# Patient Record
Sex: Male | Born: 1950 | Race: White | Hispanic: No | Marital: Married | Smoking: Never smoker
Health system: Southern US, Community
[De-identification: ages and names within clinical notes are randomized; demographics above are authoritative.]

## PROBLEM LIST (undated history)

## (undated) DIAGNOSIS — G4700 Insomnia, unspecified: Secondary | ICD-10-CM

## (undated) DIAGNOSIS — R972 Elevated prostate specific antigen [PSA]: Secondary | ICD-10-CM

## (undated) DIAGNOSIS — N400 Enlarged prostate without lower urinary tract symptoms: Secondary | ICD-10-CM

## (undated) DIAGNOSIS — D649 Anemia, unspecified: Secondary | ICD-10-CM

## (undated) DIAGNOSIS — I10 Essential (primary) hypertension: Secondary | ICD-10-CM

## (undated) DIAGNOSIS — I739 Peripheral vascular disease, unspecified: Secondary | ICD-10-CM

## (undated) DIAGNOSIS — F411 Generalized anxiety disorder: Secondary | ICD-10-CM

## (undated) DIAGNOSIS — G4730 Sleep apnea, unspecified: Secondary | ICD-10-CM

## (undated) DIAGNOSIS — K219 Gastro-esophageal reflux disease without esophagitis: Secondary | ICD-10-CM

## (undated) DIAGNOSIS — C61 Malignant neoplasm of prostate: Secondary | ICD-10-CM

## (undated) DIAGNOSIS — M1712 Unilateral primary osteoarthritis, left knee: Secondary | ICD-10-CM

## (undated) HISTORY — PX: TONSILLECTOMY: SUR1361

## (undated) HISTORY — DX: Anemia, unspecified: D64.9

## (undated) HISTORY — DX: Peripheral vascular disease, unspecified: I73.9

## (undated) HISTORY — PX: POLYPECTOMY: SHX149

## (undated) HISTORY — DX: Elevated prostate specific antigen (PSA): R97.20

## (undated) HISTORY — DX: Generalized anxiety disorder: F41.1

## (undated) HISTORY — DX: Essential (primary) hypertension: I10

## (undated) HISTORY — DX: Benign prostatic hyperplasia without lower urinary tract symptoms: N40.0

## (undated) HISTORY — DX: Gastro-esophageal reflux disease without esophagitis: K21.9

## (undated) HISTORY — DX: Sleep apnea, unspecified: G47.30

## (undated) HISTORY — PX: COLONOSCOPY: SHX174

## (undated) HISTORY — DX: Insomnia, unspecified: G47.00

## (undated) HISTORY — DX: Unilateral primary osteoarthritis, left knee: M17.12

---

## 1986-07-01 HISTORY — PX: UVULOPALATOPHARYNGOPLASTY: SHX827

## 1995-07-02 HISTORY — PX: THORACOABDOMINAL AORTIC ANEURYSM REPAIR: SHX2504

## 1999-05-23 ENCOUNTER — Encounter: Payer: Self-pay | Admitting: *Deleted

## 1999-05-23 ENCOUNTER — Encounter: Admission: RE | Admit: 1999-05-23 | Discharge: 1999-05-23 | Payer: Self-pay | Admitting: *Deleted

## 1999-06-06 ENCOUNTER — Other Ambulatory Visit: Admission: RE | Admit: 1999-06-06 | Discharge: 1999-06-06 | Payer: Self-pay | Admitting: Gastroenterology

## 1999-06-06 ENCOUNTER — Encounter (INDEPENDENT_AMBULATORY_CARE_PROVIDER_SITE_OTHER): Payer: Self-pay | Admitting: Specialist

## 2000-03-05 ENCOUNTER — Encounter: Payer: Self-pay | Admitting: Gastroenterology

## 2000-03-05 ENCOUNTER — Ambulatory Visit (HOSPITAL_COMMUNITY): Admission: RE | Admit: 2000-03-05 | Discharge: 2000-03-05 | Payer: Self-pay | Admitting: Gastroenterology

## 2000-04-09 ENCOUNTER — Ambulatory Visit (HOSPITAL_BASED_OUTPATIENT_CLINIC_OR_DEPARTMENT_OTHER): Admission: RE | Admit: 2000-04-09 | Discharge: 2000-04-09 | Payer: Self-pay | Admitting: Internal Medicine

## 2000-10-06 ENCOUNTER — Ambulatory Visit (HOSPITAL_COMMUNITY): Admission: RE | Admit: 2000-10-06 | Discharge: 2000-10-06 | Payer: Self-pay | Admitting: *Deleted

## 2000-10-06 ENCOUNTER — Encounter: Payer: Self-pay | Admitting: *Deleted

## 2001-04-13 ENCOUNTER — Encounter: Payer: Self-pay | Admitting: *Deleted

## 2001-04-13 ENCOUNTER — Ambulatory Visit (HOSPITAL_COMMUNITY): Admission: RE | Admit: 2001-04-13 | Discharge: 2001-04-13 | Payer: Self-pay | Admitting: *Deleted

## 2001-10-09 ENCOUNTER — Encounter: Payer: Self-pay | Admitting: Cardiothoracic Surgery

## 2001-10-09 ENCOUNTER — Ambulatory Visit (HOSPITAL_COMMUNITY): Admission: RE | Admit: 2001-10-09 | Discharge: 2001-10-09 | Payer: Self-pay | Admitting: Cardiothoracic Surgery

## 2002-03-12 ENCOUNTER — Ambulatory Visit (HOSPITAL_COMMUNITY): Admission: RE | Admit: 2002-03-12 | Discharge: 2002-03-12 | Payer: Self-pay | Admitting: *Deleted

## 2002-03-12 ENCOUNTER — Encounter: Payer: Self-pay | Admitting: *Deleted

## 2002-10-12 ENCOUNTER — Ambulatory Visit (HOSPITAL_COMMUNITY): Admission: RE | Admit: 2002-10-12 | Discharge: 2002-10-12 | Payer: Self-pay | Admitting: Cardiothoracic Surgery

## 2002-10-12 ENCOUNTER — Encounter: Payer: Self-pay | Admitting: Cardiothoracic Surgery

## 2003-05-25 ENCOUNTER — Ambulatory Visit (HOSPITAL_COMMUNITY): Admission: RE | Admit: 2003-05-25 | Discharge: 2003-05-25 | Payer: Self-pay | Admitting: *Deleted

## 2003-11-21 ENCOUNTER — Encounter: Admission: RE | Admit: 2003-11-21 | Discharge: 2003-11-21 | Payer: Self-pay | Admitting: *Deleted

## 2004-02-20 ENCOUNTER — Encounter: Admission: RE | Admit: 2004-02-20 | Discharge: 2004-02-20 | Payer: Self-pay | Admitting: *Deleted

## 2004-06-06 ENCOUNTER — Ambulatory Visit: Payer: Self-pay | Admitting: Internal Medicine

## 2004-06-19 ENCOUNTER — Ambulatory Visit: Payer: Self-pay | Admitting: Internal Medicine

## 2004-08-06 ENCOUNTER — Encounter: Admission: RE | Admit: 2004-08-06 | Discharge: 2004-08-06 | Payer: Self-pay | Admitting: *Deleted

## 2005-03-11 ENCOUNTER — Encounter: Admission: RE | Admit: 2005-03-11 | Discharge: 2005-03-11 | Payer: Self-pay | Admitting: *Deleted

## 2005-07-01 HISTORY — PX: ILIAC ARTERY ANEURYSM REPAIR: SUR1158

## 2005-07-05 ENCOUNTER — Ambulatory Visit: Payer: Self-pay | Admitting: Internal Medicine

## 2005-09-23 ENCOUNTER — Ambulatory Visit: Payer: Self-pay | Admitting: Internal Medicine

## 2005-09-27 ENCOUNTER — Ambulatory Visit: Payer: Self-pay | Admitting: Internal Medicine

## 2005-10-31 ENCOUNTER — Encounter: Admission: RE | Admit: 2005-10-31 | Discharge: 2005-10-31 | Payer: Self-pay | Admitting: *Deleted

## 2006-05-29 ENCOUNTER — Encounter: Admission: RE | Admit: 2006-05-29 | Discharge: 2006-05-29 | Payer: Self-pay | Admitting: *Deleted

## 2006-06-26 ENCOUNTER — Ambulatory Visit: Payer: Self-pay | Admitting: Internal Medicine

## 2006-07-16 ENCOUNTER — Ambulatory Visit (HOSPITAL_COMMUNITY): Admission: RE | Admit: 2006-07-16 | Discharge: 2006-07-16 | Payer: Self-pay | Admitting: *Deleted

## 2006-09-10 ENCOUNTER — Ambulatory Visit: Payer: Self-pay | Admitting: *Deleted

## 2006-09-12 ENCOUNTER — Inpatient Hospital Stay (HOSPITAL_COMMUNITY): Admission: RE | Admit: 2006-09-12 | Discharge: 2006-09-17 | Payer: Self-pay | Admitting: *Deleted

## 2006-09-12 ENCOUNTER — Encounter (INDEPENDENT_AMBULATORY_CARE_PROVIDER_SITE_OTHER): Payer: Self-pay | Admitting: Specialist

## 2006-09-12 ENCOUNTER — Ambulatory Visit: Payer: Self-pay | Admitting: *Deleted

## 2006-09-22 ENCOUNTER — Ambulatory Visit: Payer: Self-pay | Admitting: Internal Medicine

## 2006-09-23 ENCOUNTER — Ambulatory Visit: Payer: Self-pay | Admitting: *Deleted

## 2006-10-02 ENCOUNTER — Ambulatory Visit: Payer: Self-pay | Admitting: *Deleted

## 2006-11-13 ENCOUNTER — Ambulatory Visit: Payer: Self-pay | Admitting: *Deleted

## 2006-12-03 ENCOUNTER — Ambulatory Visit: Payer: Self-pay | Admitting: Internal Medicine

## 2006-12-03 LAB — CONVERTED CEMR LAB: PSA: 4.57 ng/mL — ABNORMAL HIGH (ref 0.10–4.00)

## 2006-12-18 ENCOUNTER — Ambulatory Visit: Payer: Self-pay | Admitting: Internal Medicine

## 2007-03-27 ENCOUNTER — Ambulatory Visit: Payer: Self-pay | Admitting: Internal Medicine

## 2007-03-27 LAB — CONVERTED CEMR LAB: PSA: 4.62 ng/mL — ABNORMAL HIGH (ref 0.10–4.00)

## 2007-11-24 ENCOUNTER — Telehealth: Payer: Self-pay | Admitting: Internal Medicine

## 2007-11-25 ENCOUNTER — Encounter: Payer: Self-pay | Admitting: Internal Medicine

## 2007-11-25 ENCOUNTER — Telehealth: Payer: Self-pay | Admitting: Internal Medicine

## 2007-11-26 ENCOUNTER — Encounter: Admission: RE | Admit: 2007-11-26 | Discharge: 2007-11-26 | Payer: Self-pay | Admitting: Gastroenterology

## 2007-11-26 ENCOUNTER — Ambulatory Visit: Payer: Self-pay | Admitting: *Deleted

## 2008-01-25 ENCOUNTER — Telehealth (INDEPENDENT_AMBULATORY_CARE_PROVIDER_SITE_OTHER): Payer: Self-pay | Admitting: *Deleted

## 2008-07-08 ENCOUNTER — Ambulatory Visit: Payer: Self-pay | Admitting: Internal Medicine

## 2008-07-08 DIAGNOSIS — D649 Anemia, unspecified: Secondary | ICD-10-CM | POA: Insufficient documentation

## 2008-07-08 DIAGNOSIS — I10 Essential (primary) hypertension: Secondary | ICD-10-CM

## 2008-07-08 DIAGNOSIS — Z800 Family history of malignant neoplasm of digestive organs: Secondary | ICD-10-CM | POA: Insufficient documentation

## 2008-07-08 DIAGNOSIS — I739 Peripheral vascular disease, unspecified: Secondary | ICD-10-CM | POA: Insufficient documentation

## 2008-07-08 DIAGNOSIS — K219 Gastro-esophageal reflux disease without esophagitis: Secondary | ICD-10-CM | POA: Insufficient documentation

## 2008-07-08 DIAGNOSIS — R972 Elevated prostate specific antigen [PSA]: Secondary | ICD-10-CM

## 2008-07-08 DIAGNOSIS — N400 Enlarged prostate without lower urinary tract symptoms: Secondary | ICD-10-CM

## 2008-07-08 DIAGNOSIS — G4700 Insomnia, unspecified: Secondary | ICD-10-CM | POA: Insufficient documentation

## 2008-07-08 DIAGNOSIS — F411 Generalized anxiety disorder: Secondary | ICD-10-CM

## 2008-07-08 DIAGNOSIS — F419 Anxiety disorder, unspecified: Secondary | ICD-10-CM | POA: Insufficient documentation

## 2008-07-08 DIAGNOSIS — F329 Major depressive disorder, single episode, unspecified: Secondary | ICD-10-CM

## 2008-07-08 DIAGNOSIS — F32A Depression, unspecified: Secondary | ICD-10-CM | POA: Insufficient documentation

## 2008-07-08 HISTORY — DX: Elevated prostate specific antigen (PSA): R97.20

## 2008-07-08 HISTORY — DX: Insomnia, unspecified: G47.00

## 2008-07-08 HISTORY — DX: Benign prostatic hyperplasia without lower urinary tract symptoms: N40.0

## 2008-07-08 HISTORY — DX: Anemia, unspecified: D64.9

## 2008-07-08 HISTORY — DX: Gastro-esophageal reflux disease without esophagitis: K21.9

## 2008-07-08 HISTORY — DX: Essential (primary) hypertension: I10

## 2008-07-08 HISTORY — DX: Peripheral vascular disease, unspecified: I73.9

## 2008-07-08 HISTORY — DX: Generalized anxiety disorder: F41.1

## 2008-12-12 ENCOUNTER — Telehealth: Payer: Self-pay | Admitting: Internal Medicine

## 2008-12-13 ENCOUNTER — Ambulatory Visit: Payer: Self-pay | Admitting: Internal Medicine

## 2008-12-14 LAB — CONVERTED CEMR LAB
ALT: 20 units/L (ref 0–53)
Alkaline Phosphatase: 59 units/L (ref 39–117)
Basophils Relative: 0.9 % (ref 0.0–3.0)
Bilirubin, Direct: 0.2 mg/dL (ref 0.0–0.3)
Cholesterol: 159 mg/dL (ref 0–200)
Creatinine, Ser: 1.1 mg/dL (ref 0.4–1.5)
Eosinophils Absolute: 0.1 10*3/uL (ref 0.0–0.7)
Glucose, Bld: 96 mg/dL (ref 70–99)
HCT: 44.6 % (ref 39.0–52.0)
HDL: 38.8 mg/dL — ABNORMAL LOW (ref >39.00)
Hemoglobin, Urine: NEGATIVE
Hemoglobin: 15.3 g/dL (ref 13.0–17.0)
Ketones, ur: NEGATIVE mg/dL
LDL Cholesterol: 102 mg/dL — ABNORMAL HIGH (ref 0–99)
Leukocytes, UA: NEGATIVE
Lymphocytes Relative: 35 % (ref 12.0–46.0)
MCHC: 34.2 g/dL (ref 30.0–36.0)
Monocytes Absolute: 0.4 10*3/uL (ref 0.1–1.0)
Neutro Abs: 2.8 10*3/uL (ref 1.4–7.7)
Nitrite: NEGATIVE
PSA: 4.01 ng/mL — ABNORMAL HIGH (ref 0.10–4.00)
Potassium: 4.1 meq/L (ref 3.5–5.1)
RBC: 5.19 M/uL (ref 4.22–5.81)
TSH: 1.76 microintl units/mL (ref 0.35–5.50)
Total Bilirubin: 1.1 mg/dL (ref 0.3–1.2)
Total CHOL/HDL Ratio: 4
Total Protein, Urine: NEGATIVE mg/dL
Triglycerides: 91 mg/dL (ref 0.0–149.0)
Urine Glucose: NEGATIVE mg/dL
Urobilinogen, UA: 0.2 (ref 0.0–1.0)
WBC: 5.1 10*3/uL (ref 4.5–10.5)

## 2008-12-15 ENCOUNTER — Encounter (INDEPENDENT_AMBULATORY_CARE_PROVIDER_SITE_OTHER): Payer: Self-pay | Admitting: *Deleted

## 2008-12-15 ENCOUNTER — Ambulatory Visit: Payer: Self-pay | Admitting: *Deleted

## 2008-12-15 ENCOUNTER — Encounter: Admission: RE | Admit: 2008-12-15 | Discharge: 2008-12-15 | Payer: Self-pay | Admitting: Vascular Surgery

## 2009-01-05 ENCOUNTER — Telehealth (INDEPENDENT_AMBULATORY_CARE_PROVIDER_SITE_OTHER): Payer: Self-pay | Admitting: *Deleted

## 2009-07-17 ENCOUNTER — Telehealth: Payer: Self-pay | Admitting: Internal Medicine

## 2009-08-21 ENCOUNTER — Telehealth: Payer: Self-pay | Admitting: Endocrinology

## 2009-10-05 ENCOUNTER — Telehealth: Payer: Self-pay | Admitting: Internal Medicine

## 2009-10-09 ENCOUNTER — Telehealth: Payer: Self-pay | Admitting: Internal Medicine

## 2009-11-14 ENCOUNTER — Ambulatory Visit: Payer: Self-pay | Admitting: Internal Medicine

## 2009-11-14 LAB — CONVERTED CEMR LAB
Albumin: 4.2 g/dL (ref 3.5–5.2)
BUN: 16 mg/dL (ref 6–23)
Bilirubin Urine: NEGATIVE
Bilirubin, Direct: 0.1 mg/dL (ref 0.0–0.3)
Calcium: 9.4 mg/dL (ref 8.4–10.5)
Chloride: 103 meq/L (ref 96–112)
Eosinophils Relative: 1.1 % (ref 0.0–5.0)
HDL: 38.6 mg/dL — ABNORMAL LOW (ref >39.00)
Hemoglobin, Urine: NEGATIVE
Ketones, ur: NEGATIVE mg/dL
Lymphocytes Relative: 34.2 % (ref 12.0–46.0)
Lymphs Abs: 1.9 10*3/uL (ref 0.7–4.0)
MCHC: 34 g/dL (ref 30.0–36.0)
Monocytes Relative: 9.5 % (ref 3.0–12.0)
Nitrite: NEGATIVE
PSA: 4.29 ng/mL — ABNORMAL HIGH (ref 0.10–4.00)
Platelets: 161 10*3/uL (ref 150.0–400.0)
RDW: 13.9 % (ref 11.5–14.6)
TSH: 1.95 microintl units/mL (ref 0.35–5.50)
Total Bilirubin: 0.6 mg/dL (ref 0.3–1.2)
Total Protein, Urine: NEGATIVE mg/dL
pH: 8 (ref 5.0–8.0)

## 2010-01-31 ENCOUNTER — Encounter: Payer: Self-pay | Admitting: Internal Medicine

## 2010-01-31 ENCOUNTER — Encounter: Admission: RE | Admit: 2010-01-31 | Discharge: 2010-01-31 | Payer: Self-pay | Admitting: Vascular Surgery

## 2010-01-31 ENCOUNTER — Ambulatory Visit: Payer: Self-pay | Admitting: Vascular Surgery

## 2010-05-14 ENCOUNTER — Telehealth: Payer: Self-pay | Admitting: Internal Medicine

## 2010-07-21 ENCOUNTER — Encounter: Payer: Self-pay | Admitting: *Deleted

## 2010-08-02 NOTE — Letter (Signed)
Summary: [facility]  [facility]   Imported By: Sherian Rein 02/14/2010 08:14:08  _____________________________________________________________________  External Attachment:    Type:   Image     Comment:   External Document

## 2010-08-02 NOTE — Progress Notes (Signed)
Summary: medication refill  Phone Note Refill Request Message from:  Fax from Pharmacy on May 14, 2010 1:41 PM  Refills Requested: Medication #1:  AMBIEN CR 12.5 MG CR-TABS 1 by mouth qhs as needed   Dosage confirmed as above?Dosage Confirmed   Last Refilled: 11/14/2009   Notes: [facility] [place], [phone] Initial call taken by: Robin Ewing CMA (AAMA),  May 14, 2010 1:46 PM    New/Updated Medications: AMBIEN CR 12.5 MG CR-TABS (ZOLPIDEM TARTRATE) 1 by mouth qhs as needed Prescriptions: AMBIEN CR 12.5 MG CR-TABS (ZOLPIDEM TARTRATE) 1 by mouth qhs as needed  #90 x 1   Entered and Authorized by:   Corwin Levins MD   Signed by:   Corwin Levins MD on 05/14/2010   Method used:   Print then Give to Patient   RxID:   [id number]  done hardcopy to LIM side B - dahlia Corwin Levins MD  May 14, 2010 2:11 PM   Rx faxed to pharmacy Margaret Pyle, CMA  May 14, 2010 2:41 PM

## 2010-08-02 NOTE — Progress Notes (Signed)
Summary: Med Refill  Phone Note Refill Request  on October 05, 2009 10:24 AM  Refills Requested: Medication #1:  AMBIEN CR 12.5 MG CR-TABS 1 by mouth qhs as needed - needs Return office visit for further refills   Dosage confirmed as above?Dosage Confirmed   Notes: [facility] [address] [phone] Initial call taken by: Scharlene Gloss,  October 05, 2009 10:25 AM  Follow-up for Phone Call        no  OV overdue Follow-up by: Corwin Levins MD,  October 05, 2009 1:06 PM  Additional Follow-up for Phone Call Additional follow up Details #1::        called pt left msg to call and schedule OV. Additional Follow-up by: Scharlene Gloss,  October 05, 2009 2:10 PM

## 2010-08-02 NOTE — Progress Notes (Signed)
Summary: Rx refill  Phone Note Call from Patient Call back at Home Phone [phone]   Caller: Patient Summary of Call: Pt called stating that he has made OV 04/18 to see MD but has no more Ambien or Atenolol. Pt is going out of town today and is requesting refill to last until appt date. Initial call taken by: Margaret Pyle, CMA,  October 09, 2009 9:46 AM  Follow-up for Phone Call        done hardcopy to LIM side B - dahlia  Follow-up by: Corwin Levins MD,  October 09, 2009 12:14 PM  Additional Follow-up for Phone Call Additional follow up Details #1::        pt informed, Rx faxed  Additional Follow-up by: Margaret Pyle, CMA,  October 09, 2009 1:01 PM    Prescriptions: AMBIEN CR 12.5 MG CR-TABS (ZOLPIDEM TARTRATE) 1 by mouth qhs as needed - needs Return office visit for further refills  #30 x 0   Entered by:   Margaret Pyle, CMA   Authorized by:   Corwin Levins MD   Signed by:   Margaret Pyle, CMA on 10/09/2009   Method used:   Printed then faxed to ...       [facility]  [address] [id number]       [address]       [place], [place]  [address]       Ph: [phone] or [phone]       Fax: [phone]   RxID:   [id number] ATENOLOL 50 MG  TABS (ATENOLOL) 1 by mouth qd  #30 Tablet x 0   Entered by:   Margaret Pyle, CMA   Authorized by:   Corwin Levins MD   Signed by:   Margaret Pyle, CMA on 10/09/2009   Method used:   Printed then faxed to ...       [facility] [id number]       [address]       [place], [place]  [address]       Ph: [phone] or [phone]       Fax: [phone]   RxID:   [id number]

## 2010-08-02 NOTE — Progress Notes (Signed)
Summary: med refill  Phone Note Refill Request  on July 17, 2009 9:24 AM  Refills Requested: Medication #1:  AMBIEN CR 12.5 MG CR-TABS 1 by mouth qhs as needed   Dosage confirmed as above?Dosage Confirmed   Notes: [facility] [address] [place], [phone] Initial call taken by: Scharlene Gloss,  July 17, 2009 9:24 AM  Follow-up for Phone Call        done hardcopy to LIM side B - dahlia  Follow-up by: Corwin Levins MD,  July 17, 2009 1:02 PM  Additional Follow-up for Phone Call Additional follow up Details #1::        rx faxed to pharmacy Additional Follow-up by: Margaret Pyle, CMA,  July 17, 2009 1:20 PM    New/Updated Medications: AMBIEN CR 12.5 MG CR-TABS (ZOLPIDEM TARTRATE) 1 by mouth qhs as needed - needs Return office visit for further refills Prescriptions: AMBIEN CR 12.5 MG CR-TABS (ZOLPIDEM TARTRATE) 1 by mouth qhs as needed - needs Return office visit for further refills  #30 x 0   Entered and Authorized by:   Corwin Levins MD   Signed by:   Corwin Levins MD on 07/17/2009   Method used:   Print then Give to Patient   RxID:   [id number]

## 2010-08-02 NOTE — Progress Notes (Signed)
Summary: Ambien  Phone Note Refill Request Message from:  Fax from Pharmacy on August 21, 2009 9:22 AM  Refills Requested: Medication #1:  AMBIEN CR 12.5 MG CR-TABS 1 by mouth qhs as needed - needs Return office visit for further refills Next Appointment Scheduled: none Initial call taken by: Lucious Groves,  August 21, 2009 9:22 AM  Follow-up for Phone Call        i printed rx f/u ov is needed Follow-up by: Minus Breeding MD,  August 21, 2009 10:03 AM  Additional Follow-up for Phone Call Additional follow up Details #1::        rx faxed to pharmacy Additional Follow-up by: Margaret Pyle, CMA,  August 21, 2009 11:35 AM    Prescriptions: AMBIEN CR 12.5 MG CR-TABS (ZOLPIDEM TARTRATE) 1 by mouth qhs as needed - needs Return office visit for further refills  #30 x 0   Entered and Authorized by:   Minus Breeding MD   Signed by:   Minus Breeding MD on 08/21/2009   Method used:   Print then Give to Patient   RxID:   [id number]

## 2010-08-02 NOTE — Assessment & Plan Note (Signed)
Summary: MED REFILL--REQUESTING PSA AND COLONSCOPY--STC   Vital Signs:  Patient profile:   60 year old male Height:      72.5 inches Weight:      196.13 pounds BMI:     26.33 O2 Sat:      97 % on Room air Temp:     97.4 degrees F oral Pulse rate:   62 / minute BP sitting:   120 / 78  (left arm) Cuff size:   regular  Vitals Entered ByZella Ball Ewing (Nov 14, 2009 11:17 AM)  O2 Flow:  Room air  CC: medication refills, labs/RE   CC:  medication refills and labs/RE.  History of Present Illness: overall doing well, no complaints,  Pt denies CP, sob, doe, wheezing, orthopnea, pnd, worsening LE edema, palps, dizziness or syncope  Pt denies new neuro symptoms such as headache, facial or extremity weakness   Problems Prior to Update: 1)  Preventive Health Care  (ICD-V70.0) 2)  Anemia-nos  (ICD-285.9) 3)  Benign Prostatic Hypertrophy  (ICD-600.00) 4)  Colonic Polyps, Hx of  (ICD-V12.72) 5)  Insomnia-sleep Disorder-unspec  (ICD-780.52) 6)  Anxiety  (ICD-300.00) 7)  Peripheral Vascular Disease  (ICD-443.9) 8)  Gerd  (ICD-530.81) 9)  Psa, Increased  (ICD-790.93) 10)  Hypertension  (ICD-401.9)  Medications Prior to Update: 1)  Atenolol 50 Mg  Tabs (Atenolol) .Marland Kitchen.. 1 By Mouth Qd 2)  Ambien Cr 12.5 Mg Cr-Tabs (Zolpidem Tartrate) .Marland Kitchen.. 1 By Mouth Qhs As Needed - Needs Return Office Visit For Further Refills 3)  Meclizine Hcl 12.5 Mg Tabs (Meclizine Hcl) .Marland Kitchen.. 1 - 2 By Mouth Q 6 Hrs As Needed  Current Medications (verified): 1)  Atenolol 50 Mg  Tabs (Atenolol) .Marland Kitchen.. 1 By Mouth Once Daily 2)  Ambien Cr 12.5 Mg Cr-Tabs (Zolpidem Tartrate) .Marland Kitchen.. 1 By Mouth Qhs As Needed 3)  Meclizine Hcl 12.5 Mg Tabs (Meclizine Hcl) .Marland Kitchen.. 1 - 2 By Mouth Q 6 Hrs As Needed For Dizziness  Allergies (verified): 1)  ! 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         all otherwise negative per pt -  except for ongoing work stress  Physical Exam  General:  alert and overweight-appearing.   Head:  normocephalic and atraumatic.   Eyes:  vision grossly intact, pupils equal, and pupils round.   Ears:  R ear normal and L ear normal.   Nose:  no external deformity and no nasal discharge.   Mouth:  no gingival abnormalities and pharynx pink and moist.   Neck:  supple and no masses.   Lungs:  normal respiratory effort and normal breath sounds.   Heart:  normal rate and regular rhythm.   Abdomen:  soft, non-tender, and normal bowel sounds.   Msk:  no joint tenderness and no joint swelling.   Extremities:  no edema, no erythema  Neurologic:  cranial nerves II-XII intact and strength normal in all extremities.   Skin:  color normal and no rashes.   Psych:  not depressed appearing and moderately anxious.     Impression & Recommendations:  Problem # 1:  Preventive  Health Care (ICD-V70.0)  Overall doing well, age appropriate education and counseling updated and referral for appropriate preventive services done unless declined, immunizations up to date or declined, diet counseling done if overweight, urged to quit smoking if smokes , most recent labs reviewed and current ordered if appropriate, ecg reviewed or declined (interpretation per ECG scanned in the EMR if done); information regarding Medicare Prevention requirements given if appropriate; speciality referrals updated as appropriate   Orders: TLB-BMP (Basic Metabolic Panel-BMET) (80048-METABOL) TLB-CBC Platelet - w/Differential (85025-CBCD) TLB-Hepatic/Liver Function Pnl (80076-HEPATIC) TLB-Lipid Panel (80061-LIPID) TLB-TSH (Thyroid Stimulating Hormone) (84443-TSH) TLB-PSA (Prostate Specific Antigen) (84153-PSA) TLB-Udip ONLY (81003-UDIP)  Problem # 2:  HYPERTENSION (ICD-401.9)  His updated medication list for this problem includes:    Atenolol 50 Mg Tabs (Atenolol) .Marland Kitchen... 1 by mouth once daily  BP today: 120/78 Prior BP: 110/62 (07/08/2008)  Labs Reviewed: K+: 4.1 (12/13/2008) Creat: : 1.1 (12/13/2008)   Chol: 159 (12/13/2008)   HDL: 38.80 (12/13/2008)   LDL: 102 (12/13/2008)   TG: 91.0 (12/13/2008) stable overall by hx and exam, ok to continue meds/tx as is   Complete Medication List: 1)  Atenolol 50 Mg Tabs (Atenolol) .Marland Kitchen.. 1 by mouth once daily 2)  Ambien Cr 12.5 Mg Cr-tabs (Zolpidem tartrate) .Marland Kitchen.. 1 by mouth qhs as needed 3)  Meclizine Hcl 12.5 Mg Tabs (Meclizine hcl) .Marland Kitchen.. 1 - 2 by mouth q 6 hrs as needed for dizziness  Patient Instructions: 1)  Please go to the Lab in the basement for your blood and/or urine tests today 2)  Continue all previous medications as before this visit  3)  Please schedule a follow-up appointment in 1 year or sooner if needed Prescriptions: MECLIZINE HCL 12.5 MG TABS (MECLIZINE HCL) 1 - 2 by mouth q 6 hrs as needed for dizziness  #50 x 1   Entered and  Authorized by:   Corwin Levins MD   Signed by:   Corwin Levins MD on 11/14/2009   Method used:   Print then Give to Patient   RxID:   [id number] AMBIEN CR 12.5 MG CR-TABS (ZOLPIDEM TARTRATE) 1 by mouth qhs as needed  #90 x 1   Entered and Authorized by:   Corwin Levins MD   Signed by:   Corwin Levins MD on 11/14/2009   Method used:   Print then Give to Patient   RxID:   [id number] ATENOLOL 50 MG  TABS (ATENOLOL) 1 by mouth once daily  #90 x 3   Entered and Authorized by:   Corwin Levins MD   Signed by:   Corwin Levins MD on 11/14/2009   Method used:   Print then Give to Patient   RxID:   [id number]

## 2010-09-13 ENCOUNTER — Encounter: Payer: Self-pay | Admitting: Internal Medicine

## 2010-09-18 NOTE — Letter (Signed)
Summary: Colonoscopy Letter  [facility]  [address]   [place], [place] [address]   Phone: [phone]  Fax: [phone]      September 13, 2010 MRN: [id number]   [facility] [address] [place], [place]  [address]   Dear Mr. ENDICOTT,   According to your medical record, it is time for you to schedule a Colonoscopy. The American Cancer Society recommends this procedure as a method to detect early colon cancer. Patients with a family history of colon cancer, or a personal history of colon polyps or inflammatory bowel disease are at increased risk.  This letter has been generated based on the recommendations made at the time of your procedure. If you feel that in your particular situation this may no longer apply, please contact our office.  Please call our office at [phone] to schedule this appointment or to update your records at your earliest convenience.  Thank you for cooperating with [place] to provide you with the very best care possible.   Sincerely,   Stan Head, M.D.  [facility] [phone]

## 2010-11-13 NOTE — Assessment & Plan Note (Signed)
OFFICE VISIT   James Mullen, James Mullen  DOB:  06-25-1951                                       01/31/2010  FIEPP#:[id number]   The patient returns for follow-up today.  He was previously a patient of  Dr. 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  He was last seen by Dr. Madilyn Fireman in June of 2010.  Since Dr. Madilyn Fireman  has moved to [place] I have assumed the patient's care.  He denies  any abdominal pain, back pain or significant problems in the past year.  He is very active with outdoor activities including canoeing and  walking.  I reviewed his previous operative note today which shows that  the aorta was replaced from his descending thoracic aorta just distal to  the left subclavian vein down to the visceral segment including a  visceral patch and then subsequently he has undergone an infrarenal  abdominal aortic aneurysm repair with an aortobi-iliac graft.  The  aortobi-iliac graft was in 2008.  The thoracoabdominal aneurysm was in  1997.  He had a CT angio of the chest, abdomen and pelvis performed  today which shows the graft is patent without any evidence of  pseudoaneurysm.  Graft diameter is slightly greater than 3 cm at the  chest level.  Aorta is of normal caliber on infrarenal position.  Visceral and renal arteries are widely patent.  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  Otherwise there is no evidence of  aneurysmal disease in the aorta or the iliac arteries.  The femoral  vessels are ectatic.   PHYSICAL EXAMINATION:  Blood pressure is 130/81 in the left arm, heart  rate is 54 and regular, respirations 20.  HEENT:  Unremarkable.  NECK:  Has 2+ carotid pulses without bruit.  Chest:  Clear to auscultation.  Cardiac:  Exam is regular rate rhythm without murmur.  Abdomen:  Soft,  nontender, nondistended with well-healed incision.  He  has 2+ femoral  pulses bilaterally.  He has 2+ dorsalis pedis pulses bilaterally.   REVIEW OF SYSTEMS:  He denies any shortness of breath or chest pain.   Overall the patient is doing well.  He had no problems in the past year  and his aneurysm is stable on CT angio.  I believe the best option for  him at this point is to go to once yearly abdominal aortic ultrasounds.  We may consider extending this out further.  I do not believe he needs  his chest reevaluated for several years.  He will follow up with me in  one year's time.     Janetta Hora. Fields, MD  Electronically Signed   CEF/MEDQ  D:  01/31/2010  T:  01/31/2010  Job:  [id number]   cc:   Corwin Levins, MD  Kerin Perna, M.D.

## 2010-11-13 NOTE — Assessment & Plan Note (Signed)
OFFICE VISIT   James Mullen, James Mullen  DOB:  09/01/1950                                       11/26/2007  ZOXWR#:[id number]   The patient returned today for continued followup of his repaired type 3  aortic dissection and also infrarenal abdominal aortic aneurysm repair.  This most recent operation was in March of 2008 with infrarenal aortic  aneurysm repair and bilateral common iliac aneurysm repair.   His CT scan looks excellent.  His thoracic repair is intact without  complicating features.  The infrarenal aorta and bi-iliac graft are  intact.   The patient has no major complaints at this time.  BP is 117/67, pulse  75 per minute.  His chest is clear without rales or rhonchi.  Normal  heart sounds without murmurs.  Incisions well-healed.  Abdomen:  Soft,  nontender.  No masses or organomegaly.  2+ femoral pulses bilaterally.   The patient is doing extremely well following his extensive aortic  repairs.  We will plan followup with him again in 1 year with repeat CT  scan.   Balinda Quails, M.D.  Electronically Signed   PGH/MEDQ  D:  11/26/2007  T:  11/27/2007  Job:  [id number]   cc:   Corwin Levins, MD  Kerin Perna, M.D.

## 2010-11-13 NOTE — Assessment & Plan Note (Signed)
OFFICE VISIT   KEONA, SHEFFLER  DOB:  1951/04/28                                       12/15/2008  ZOXWR#:[id number]   The patient is a 60 year old gentleman who has a history of a type 3  aortic dissection requiring urgent repair in 1997, followed by an  infrarenal abdominal aortic aneurysm repair and bilateral iliac aneurysm  repair carried out in 2008.  He follows up with me on a yearly basis due  to his aortic pathology.   He underwent a CT angiogram of the chest, abdomen and pelvis prior to  his current visit.  Findings are generally stable.  Postop surgical  changes noted in the chest without interval change over the past year.  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  The abdominal aorta is stable  without complicating features associated with his postoperative repair.  Similar findings in the abdominal aorta with an aortobi-iliac graft.  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  This is unchanged.  No other acute findings.   The patient appears well.  He is alert and oriented.  BP is 106/64,  pulse 67 per minute.  Abdomen is soft, nontender.  Intact femoral pulses  bilaterally.  His chest is clear with equal air entry bilaterally.  Normal heart sounds without murmurs.   Overall the patient is doing well.  We will plan followup again in 1  year with repeat CT scan.   Balinda Quails, M.D.  Electronically Signed   PGH/MEDQ  D:  12/15/2008  T:  12/16/2008  Job:  [id number]   cc:   Kerin Perna, M.D.  Corwin Levins, MD

## 2010-11-16 NOTE — Op Note (Signed)
[name], [name] NO.:  [id number]   MEDICAL RECORD NO.:  [id number]          PATIENT TYPE:  INP   LOCATION:  2039                         FACILITY:  [facility]   PHYSICIAN:  Balinda Quails, M.D.    DATE OF BIRTH:  June 17, 1951   DATE OF PROCEDURE:  09/15/2006  DATE OF DISCHARGE:                               OPERATIVE REPORT   SURGEON:  Denman George, MD   ASSISTANT:  Constance Holster, PA.   ANESTHETIC:  General endotracheal.   PREOPERATIVE DIAGNOSES:  1. A 5.5 cm infrarenal abdominal aortic aneurysm.  2. 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  3. Right common iliac artery ectasia.   POSTOPERATIVE DIAGNOSES:  1. A 5.5 cm infrarenal abdominal aortic aneurysm.  2. 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  3. Right common iliac artery ectasia.   PROCEDURES:  1. Repair of infrarenal abdominal aortic aneurysm and common iliac      aneurysms with aortobi-iliac graft.  2. Lysis of adhesions.   CLINICAL NOTE:  Mr. James Mullen is a 60 year old male with a history  of type B aortic dissection approximately 10 years ago requiring urgent  repair for rapid enlargement.  At that time he was noted to a small  infrarenal abdominal aortic aneurysm and dilatation of his iliac  arteries.  He has been followed in the office and this aneurysm of his  infrarenal aorta is now increased to 5.5 cm in size, and his left common  iliac artery measures 3 cm.  He is brought to the operating at this time  for open elective repair.   OPERATIVE PROCEDURE:  The patient brought to the operating room in  stable hemodynamic condition.  Placed under general endotracheal  anesthesia.  Foley catheter, arterial line, Swan-Ganz catheter in place.  In the supine position, the abdomen and both legs were prepped and  draped in a sterile fashion.   A midline skin incision made through the scar from xiphoid to pubis.  Dissection carried down through the subcutaneous tissue with  electrocautery.  The peritoneal cavity entered.  There were dense  adhesions in the left upper quadrant from previous left thoracoabdominal  incision.  The omentum had to be mobilized off the anterior abdominal  wall.  There were adhesions to small bowel also present to the omentum,  and these were divided.  The liver was freed from the anterior abdominal  wall.  The omentum fully mobilized.   The small bowel was also adhesed to the sigmoid colon.  Small bowel  fully mobilized to allow retraction to the right.   Limited laparotomy evaluation was carried out due to dense adhesions.  The right upper quadrant was fixed with adhesions.  The left colon  revealed no masses.  The retroperitoneum did verify an infrarenal  abdominal aortic aneurysm.   The small bowel was retracted to the right, transverse colon brought  superiorly.  The retroperitoneum incised along the infrarenal aorta.  The retroperitoneal dissection carried superiorly up to the origin of  the renal arteries bilaterally.  There was a short infrarenal aortic  neck.  Adequate neck for infrarenal clamp placement.  Renal arteries  clearly identified bilaterally.  The left renal vein skeletonized and  retracted superiorly.   Distal dissection then carried down along the aortic aneurysm sac and  the origin of the superior mesenteric artery was encircled with a fine  vessel loop.   Further distal dissection then carried along the right common iliac  artery.  The right common iliac bifurcation into hypogastric and  external iliac were freed.  The right ureter reflected inferiorly.  The  right external iliac and hypogastric arteries were encircled with vessel  loops.   The sigmoid mesocolon was then mobilized along the peritoneal  reflection.  Retroperitoneum entered and the left common iliac  bifurcation was exposed.  The left ureter reflected laterally.  The  origin of the external and internal iliac arteries were encircled  with  vessel loops on the left.   The patient administered 5000 units of heparin intravenously, 25 g of  mannitol intravenously.  The infrarenal aorta controlled with an aortic  DeBakey clamp.  The common iliac arteries bilaterally were controlled  with coarctation clamps and the hypogastric arteries controlled with  angled coarctation clamps.   The aneurysm sac opened longitudinally.  Backbleeding lumbar vessels  controlled with figure-of-eight 2-0 silk suture.  A small amount of  laminated thrombus removed.  The aneurysm sac opened up to the neck.  An  18 x 9 bifurcated Hemashield graft was anastomosed to the infrarenal  aortic neck using running 3-0 Prolene suture.  At completion of the  proximal anastomosis, the graft was flushed and each limb of the graft  controlled with a Fogarty clamp.   The right limb of the graft was then brought down to the right common  iliac bifurcation.  The right common iliac artery divided just proximal  to its bifurcation and the right limb of the graft was anastomosed end-  to-end to this using running 5-0 Prolene suture.  At completion of the  right iliac anastomosis, the right limb was flushed and the right leg  reperfused.   The left limb of the graft was tunneled through the left common iliac  artery aneurysm and the left common iliac artery divided at its  bifurcation.  The left limb of the graft was anastomosed end-to-end to  the left common iliac bifurcation using running 5-0 Prolene suture.  At  completion of this, the graft was flushed, clamps were removed, the left  leg reperfused.   Adequate hemostasis obtained.  Sponge and instrument counts correct.  The patient administered 50 mg of protamine intravenously.   The aneurysm was then closed over the graft using running 2-0 Vicryl  suture.  The retroperitoneum closed over the graft with running 3-0  Vicryl suture.  The sponge and instrument count was correct.  The abdomen was examined  to ensure there were no retained instruments or  sponges.   The midline abdominal fascia then closed running #1 PDS suture.  Skin  closed with staples.  Sterile dressings were applied.  The patient  tolerated the procedure well.      Balinda Quails, M.D.  Electronically Signed     PGH/MEDQ  D:  09/15/2006  T:  09/16/2006  Job:  [id number]   cc:   Noralyn Pick. Eden Emms, MD, [facility]  Corwin Levins, MD

## 2010-11-16 NOTE — Op Note (Signed)
James Mullen, James Mullen             ACCOUNT NO.:  [id number]   MEDICAL RECORD NO.:  [id number]          PATIENT TYPE:  AMB   LOCATION:  SDS                          FACILITY:  [facility]   PHYSICIAN:  Balinda Quails, M.D.    DATE OF BIRTH:  [birth year]   DATE OF PROCEDURE:  07/16/2006  DATE OF DISCHARGE:  07/16/2006                               OPERATIVE REPORT   PHYSICIAN:  Denman George, MD.   DIAGNOSES:  1. A 5.9-cm infrarenal abdominal aortic aneurysm.  2. A 2.7-cm left common iliac artery aneurysm.  3. Ectasia, right common iliac artery.   PROCEDURE:  Abdominal aortogram with pelvic runoff arteriography.   ACCESS:  Right common femoral artery 5-French sheath.   CONTRAST:  100 mL of Visipaque.   COMPLICATIONS:  None apparent.   CLINICAL NOTE:  James Mullen is a 60 year old male with history of  type B aortic dissection requiring urgent repair in 1997.  His initial  repair was carried out down to the level of the renal arteries.  He is  known to have aneurysmal disease of his infrarenal aorta and left common  iliac artery.  He has been followed the office regularly and his  infrarenal aorta has now enlarged to 5.9 cm in transverse diameter with  a left common iliac artery aneurysm of 2.7 cm.  He is brought to the  cath lab at this time for planned diagnostic arteriography for further  workup and planning of surgery for repair of his infrarenal abdominal  aortic aneurysm.   PROCEDURE NOTE:  The patient was brought to the cath lab in stable  condition, placed in a supine position, both groins prepped and draped  in a sterile fashion.   Skin and subcutaneous tissue in the right groin were instilled with 1%  Xylocaine.  A needle was introduced into the right common femoral  artery.  A 0.35 J-wire was passed through the needle into the mid  abdominal aorta.  A 5-French sheath was advanced over the guidewire and  the dilator and guidewire removed.  The sheath was flushed with  heparin  and saline solution.  A standard pigtail catheter was advanced over the  guidewire to the mid abdominal aorta.  Standard AP mid abdominal  aortogram was obtained.  This revealed widely patent bilateral renal  arteries.  There was a juxtarenal abdominal aortic aneurysm present  which extended from the renal artery origins down to the aortic  bifurcation.  There was moderate left angulation and tortuosity.   A lateral aortogram verified a widely patent superior mesenteric artery.   The infrarenal aorta revealed aneurysmal change.  The pigtail catheter  was brought down to the aortic bifurcation.  Pelvic arteriography was  obtained.  There was aneurysmal dilatation of the left common iliac  artery and ectasia of the right common iliac artery.  Arteriomegaly was  noted generally with intact flow to the common femoral level bilaterally  and patent hypogastric arteries bilaterally.   This completed the arteriogram procedure.  The guidewire was reinserted  and pigtail catheter removed.  Right femoral sheath was removed.  No  apparent complications.   FINAL IMPRESSION:  1. A 5.9-cm juxtarenal abdominal aortic aneurysm.  2. Left common iliac artery aneurysm, 2.7 cm.  3. Ectasia, right common iliac artery.  4. Generalized arteriomegaly.   DISPOSITION:  The results have been discussed with the patient and he  will be scheduled electively for repair of infrarenal abdominal aortic  aneurysm and left common iliac artery aneurysm.      Balinda Quails, M.D.  Electronically Signed     PGH/MEDQ  D:  10/03/2006  T:  10/04/2006  Job:  [id number]   cc:   Corwin Levins, MD

## 2010-11-16 NOTE — Discharge Summary (Signed)
NAMEDOMENIC, SCHOENBERGER NO.:  [id number]   MEDICAL RECORD NO.:  [id number]          PATIENT TYPE:  INP   LOCATION:  2039                         FACILITY:  [facility]   PHYSICIAN:  [name], P.A.-C. DATE OF BIRTH:  08/13/1950   DATE OF ADMISSION:  09/12/2006  DATE OF DISCHARGE:  09/17/2006                               DISCHARGE SUMMARY   HISTORY OF PRESENT ILLNESS:  The patient is a 60 year old white male  with a history of abdominal aortic aneurysm and thoracoabdominal  aneurysm.  He is status post repair of a thoracic aneurysm secondary to  a type B dissection in August of 1997 by Dr. Madilyn Fireman and Dr. Donata Clay.  He has been monitored closely with surveillance of his abdominal aortic  aneurysm and left iliac aneurysms.  Dr. Madilyn Fireman felt as though he was now  a candidate for resection and grafting of the infrarenal abdominal  aortic aneurysm and he was admitted this hospitalization for the  procedure.  His most recent measurements by CT scan were 5.9 cm in  transverse diameter.  Arteriogram was done, which confirmed these  findings.  It was reviewed by Dr. Madilyn Fireman.  He was admitted this  hospitalization essentially asymptomatic.  He denied abdominal pain,  nausea, vomiting, constipation, hematochezia, hematemesis, back pain,  claudication symptoms, peripheral edema, dysuria, hematuria, angina,  palpitations, history of arrhythmias, or history of TIA or CVA.   PAST MEDICAL HISTORY:  1. History of abdominal aortic aneurysm and left iliac artery      aneurysms.  2. History of type B aortic aneurysm dissection, status post repair.  3. History of hypertension.  4. History of chronic anemia.  5. History of gastroesophageal reflux.  6. History of sleep apnea.   PAST SURGICAL HISTORY:  Includes repair of 7-cm type B aneurysm  secondary to dissection in 1997 as described above.  Other surgeries  include tonsillectomy and uvuloplasty for sleep apnea and also left knee  arthroscopy.   ALLERGIES:  PENICILLIN CAUSES A RASH.   MEDICATIONS PRIOR TO ADMISSION:  Atenolol 25 mg daily.  He also takes  multiple supplements and vitamins.   REVIEW OF SYMPTOMS, SOCIAL HISTORY, FAMILY HISTORY, PHYSICAL EXAM:  Please see the history and physical done at the time of admission.   HOSPITAL COURSE:  The patient was admitted electively and on September 12, 2006, he was taken to the operating room, at which time he underwent a  resection and grafting of the abdominal aortic aneurysm with aorto by  common iliac bypass with an 18-mm Hemashield Dacron graft.  Procedure  was performed by Dr. Liliane Bade, tolerated well, and he was taken to the  Postanesthesia Care Unit in stable condition.   POSTOPERATIVE HOSPITAL COURSE:  The patient has done well.  He has  maintained stable hemodynamics.  He has tolerated a routine advancement  in activity and diet.  His incision is healing well without evidence of  infection.  His laboratory values are stable.  He does have a  postoperative anemia.  Most recent hemoglobin dated September 16, 2006, was  8.7.  Electrolytes, BUN and  creatinine are all within normal limits.  His pulmonary status is showing good and gradual improvement and he has  been weaned from oxygen maintaining good saturations on room air.  His  overall status is felt to be tentatively stable for discharge on September 17, 2006.   INSTRUCTIONS:  The patient will receive written instructions in regard  to medications, activity, and diet, wound care, and followup.  Followup  will include staple removal on September 23, 2006, at 10 a.m. at the  vascular surgery office.  Additionally, he will see Dr. Madilyn Fireman on October 02, 2006, at 12:30 for surgical followup.   MEDICATIONS ON DISCHARGE:  Include:  1. Atenolol 25 mg daily.  2. Aspirin 81 mg daily.  3. For pain, Tylox one or two every 6 hours as needed.  4. He can resume his vitamins and supplements as he wishes.   FINAL DIAGNOSIS:   Infrarenal abdominal aortic aneurysm, now status post  resection and grafting as described above.  Other diagnoses as  previously listed per the history.  Also postoperative anemia.      [name], P.A.-C.     Sherryll Burger  D:  09/16/2006  T:  09/16/2006  Job:  [id number]   cc:   Balinda Quails, M.D.  Corwin Levins, MD

## 2010-11-16 NOTE — H&P (Signed)
James Mullen, PLANTE NO.:  [id number]   MEDICAL RECORD NO.:  [id number]          PATIENT TYPE:  INP   LOCATION:  2399                         FACILITY:  [facility]   PHYSICIAN:  James Mullen, M.D.    DATE OF BIRTH:  06-25-1951   DATE OF ADMISSION:  09/12/2006  DATE OF DISCHARGE:                              HISTORY & PHYSICAL   CHIEF COMPLAINT:  Abdominal aortic aneurysm.   HISTORY OF PRESENT ILLNESS:  The patient is a 60 year old white male  with a history of abdominal aortic aneurysm and thoracoabdominal  aneurysm.  He is status post repair of the thoracoabdominal aneurysm  secondary to a type B dissection in August of 1997 by Drs. Madilyn Fireman and [employer].  He has been monitored closely with surveillance of his abdominal  aortic aneurysm and left iliac aneurysms.  Dr. Madilyn Fireman now feels he is a  candidate for resection and grafting of the infrarenal abdominal aortic  aneurysm.  His most recent measurements by CT scan is 5.9 cm in  transverse diameter.  He apparently had an arteriogram as well recently,  but those results are currently not available in the office chart.  There have been reviewed by Dr. Madilyn Fireman prior to surgery and confirmed the  aneurysm findings, and Dr. Madilyn Fireman elects at this time to proceed with the  repair.  Currently he is essentially asymptomatic.  He has occasional  reflux symptoms.  However, no abdominal pain, nausea, vomiting,  constipation, hematochezia, hematemesis, back pain, claudication  symptoms, peripheral edema, dysuria, hematuria, angina, palpitations,  history of arrhythmias, history of TIA or CVA.   PAST MEDICAL HISTORY:  1. History of abdominal aortic aneurysm and left iliac artery      aneurysms.  2. History of type B aortic aneurysm dissection status post repair.  3. History of hypertension.  4. History of chronic anemia.  5. History of gastroesophageal reflux.  6. History of sleep apnea.   PAST SURGICAL HISTORY:  1. Repair of  7-cm thoracoabdominal type B aneurysm secondary to      dissection in 1997, as described above.  2. Tonsillectomy and uvuloplasty for sleep apnea.  3. Left knee arthroscopy.   ALLERGIES:  PENICILLIN CAUSES A RASH.   CURRENT MEDICATIONS:  Atenolol 25 mg p.o. daily.   REVIEW OF SYSTEMS:  See the history of the present illness for pertinent  positives or negatives; otherwise, is noncontributory with the exception  of a recent snowboarding accident on August 29, 2006 where he states  he tore the clavicle loose on the left side.  At this time he has been  rehabilitating in on his own, and he wears a sling and swath.   SOCIAL HISTORY:  He is single with one child.  No tobacco use.  Rare  alcohol use.  He works in Armed forces training and education officer.   FAMILY HISTORY:  Remarkable for father with colon carcinoma; otherwise,  unremarkable.   PHYSICAL EXAMINATION:  VITAL SIGNS:  Blood pressure 98/50, heart rate  74, respirations 12.  GENERAL:  60 year old white male in no acute distress.  HEENT:  Normocephalic, atraumatic.  Pupils  equal, round, and reactive to  light.  Extraocular movements intact.  Oral mucosa is pink and moist.  No lesions.  Sclerae is nonicteric.  No retinopathy.  Pharynx is clear  without exudates or erythema.  NECK:  Supple.  No jugular venous distension, no carotid bruits.  No  lymphadenopathy, no thyromegaly.  PULMONARY:  Symmetrical on inspiration, unlabored.  Clear breath sounds  without wheezes, rhonchi or crackles.  CARDIAC:  Regular rate and rhythm without murmur, gallop or rub.  ABDOMEN:  Soft, nontender, nondistended.  Normoactive bowel sounds.  The  aorta is palpable in the area umbilical to mid- epigastric region  consistent with the aneurysm diagnosis.  GENITOURINARY/RECTAL:  Deferred.  EXTREMITIES:  No edema, no varicosities.  No venous stasis changes.  No  ulcerations.  Peripheral pulses are equal and intact bilaterally.  NEUROLOGIC:  Grossly nonfocal with the  exception of the left shoulder  which shows gross decreased strength and range of motion.  Gait is  steady.   ASSESSMENT:  Abdominal aortic aneurysm for planned resection and  grafting per Dr. Madilyn Fireman.      James Mullen, P.A.-C.      James Mullen, M.D.  Electronically Signed    WEG/MEDQ  D:  09/10/2006  T:  09/12/2006  Job:  [id number]   cc:   James Mullen, M.D.  Corwin Levins, MD

## 2010-12-12 ENCOUNTER — Other Ambulatory Visit: Payer: Self-pay

## 2010-12-12 MED ORDER — ZOLPIDEM TARTRATE ER 12.5 MG PO TBCR: 12.5000 mg | EXTENDED_RELEASE_TABLET | Freq: Every evening | ORAL | Status: DC | PRN

## 2010-12-12 NOTE — Telephone Encounter (Signed)
Faxed hardcopy to pharmacy. 

## 2010-12-25 ENCOUNTER — Ambulatory Visit (INDEPENDENT_AMBULATORY_CARE_PROVIDER_SITE_OTHER): Payer: PRIVATE HEALTH INSURANCE | Admitting: Internal Medicine

## 2010-12-25 ENCOUNTER — Encounter: Payer: Self-pay | Admitting: Internal Medicine

## 2010-12-25 VITALS — BP 110/68 | HR 83 | Temp 98.6°F | Ht 74.0 in | Wt 197.1 lb

## 2010-12-25 DIAGNOSIS — Z0000 Encounter for general adult medical examination without abnormal findings: Secondary | ICD-10-CM

## 2010-12-25 DIAGNOSIS — M549 Dorsalgia, unspecified: Secondary | ICD-10-CM

## 2010-12-25 DIAGNOSIS — M1712 Unilateral primary osteoarthritis, left knee: Secondary | ICD-10-CM | POA: Insufficient documentation

## 2010-12-25 HISTORY — DX: Unilateral primary osteoarthritis, left knee: M17.12

## 2010-12-25 MED ORDER — ATENOLOL 50 MG PO TABS: 50.0000 mg | ORAL_TABLET | Freq: Every day | ORAL | Status: DC

## 2010-12-25 MED ORDER — CYCLOBENZAPRINE HCL 5 MG PO TABS: 5.0000 mg | ORAL_TABLET | Freq: Three times a day (TID) | ORAL | Status: AC | PRN

## 2010-12-25 MED ORDER — ATENOLOL 50 MG PO TABS: 25.0000 mg | ORAL_TABLET | Freq: Every day | ORAL | Status: DC

## 2010-12-25 NOTE — Progress Notes (Signed)
Subjective:    Patient ID: James Mullen, male    DOB: Apr 19, 1951, 60 y.o.   MRN: [id number]  HPI Here for wellness and f/u;  Overall doing ok;  Pt denies CP, worsening SOB, DOE, wheezing, orthopnea, PND, worsening LE edema, palpitations, dizziness or syncope.  Pt denies neurological change such as new Headache, facial or extremity weakness.  Pt denies polydipsia, polyuria, or low sugar symptoms. Pt states overall good compliance with treatment and medications, good tolerability, and trying to follow lower cholesterol diet.  Pt denies worsening depressive symptoms, suicidal ideation or panic. No fever, wt loss, night sweats, loss of appetite, or other constitutional symptoms.  Pt states good ability with ADL's, low fall risk, home safety reviewed and adequate, no significant changes in hearing or vision, and occasionally active with exercise. Does have mild right trapezoid tenderness for 2 wks after falling asleep in an awkward position on a plane.   Past Medical History  Diagnosis Date  . ANEMIA-NOS 07/08/2008  . ANXIETY 07/08/2008  . BENIGN PROSTATIC HYPERTROPHY 07/08/2008  . GERD 07/08/2008  . HYPERTENSION 07/08/2008  . 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 PERIPHERAL VASCULAR DISEASE 07/08/2008  . PSA, INCREASED 07/08/2008  . COLONIC POLYPS, HX OF 07/08/2008  . INSOMNIA-SLEEP DISORDER-UNSPEC 07/08/2008   No past surgical history on file.  reports that he has never smoked. He does not have any smokeless tobacco history on file. He reports that he drinks alcohol. His drug history not on file. family history is not on file. Allergies  Allergen Reactions  . Penicillins    Current Outpatient Prescriptions on File Prior to Visit  Medication Sig Dispense Refill  . zolpidem (AMBIEN CR) 12.5 MG CR tablet Take 1 tablet (12.5 mg total) by mouth at bedtime as needed for sleep.  30 tablet  0   Review of Systems Review of Systems  Constitutional: Negative for diaphoresis, activity change, appetite change and unexpected  weight change.  HENT: Negative for hearing loss, ear pain, facial swelling, mouth sores and neck stiffness.   Eyes: Negative for pain, redness and visual disturbance.  Respiratory: Negative for shortness of breath and wheezing.   Cardiovascular: Negative for chest pain and palpitations.  Gastrointestinal: Negative for diarrhea, blood in stool, abdominal distention and rectal pain.  Genitourinary: Negative for hematuria, flank pain and decreased urine volume.  Musculoskeletal: Negative for myalgias and joint swelling.  Skin: Negative for color change and wound.  Neurological: Negative for syncope and numbness.  Hematological: Negative for adenopathy.  Psychiatric/Behavioral: Negative for hallucinations, self-injury, decreased concentration and agitation.      Objective:   Physical Exam BP 110/68  Pulse 83  Temp(Src) 98.6 F (37 C) (Oral)  Ht 6\' 2"  (1.88 m)  Wt 197 lb 2 oz (89.415 kg)  BMI 25.31 kg/m2  SpO2 97% Physical Exam  VS noted Constitutional: Pt is oriented to person, place, and time. Appears well-developed and well-nourished.  HENT:  Head: Normocephalic and atraumatic.  Right Ear: External ear normal.  Left Ear: External ear normal.  Nose: Nose normal.  Mouth/Throat: Oropharynx is clear and moist.  Eyes: Conjunctivae and EOM are normal. Pupils are equal, round, and reactive to light.  Neck: Normal range of motion. Neck supple. No JVD present. No tracheal deviation present.  Cardiovascular: Normal rate, regular rhythm, normal heart sounds and intact distal pulses.   Pulmonary/Chest: Effort normal and breath sounds normal.  Abdominal: Soft. Bowel sounds are normal. There is no tenderness.  Musculoskeletal: Normal range of motion. Exhibits no  edema.  Lymphadenopathy:  Has no cervical adenopathy.  Neurological: Pt is alert and oriented to person, place, and time. Pt has normal reflexes. No cranial nerve deficit.  Skin: Skin is warm and dry. No rash noted.  Psychiatric:   Has  normal mood and affect. Behavior is normal. stressed for time today Right trapezoid tender noted       Assessment & Plan:

## 2010-12-25 NOTE — Assessment & Plan Note (Signed)
Right trapezoid strain - for muscle relaxer prn

## 2010-12-25 NOTE — Patient Instructions (Signed)
Take all new medications as prescribed Continue all other medications as before Please return at your convenience for the blood tests Please call the phone number [phone] (the [employer]Tree System) for results of testing in 2-3 days;  When calling, simply dial the number, and when prompted enter the MRN number above (the Medical Record Number) and the # key, then the message should start. Please return in 1 year for your yearly visit, or sooner if needed, with Lab testing done 3-5 days before

## 2010-12-25 NOTE — Assessment & Plan Note (Signed)

## 2010-12-27 ENCOUNTER — Telehealth: Payer: Self-pay | Admitting: Internal Medicine

## 2010-12-27 NOTE — Telephone Encounter (Signed)
I have scheduled an colon with Leone Payor for 01/08/11 and a pre-visit tomorrow

## 2011-01-08 ENCOUNTER — Other Ambulatory Visit: Payer: PRIVATE HEALTH INSURANCE | Admitting: Internal Medicine

## 2011-01-28 ENCOUNTER — Encounter: Payer: Self-pay | Admitting: Internal Medicine

## 2011-02-18 ENCOUNTER — Other Ambulatory Visit (INDEPENDENT_AMBULATORY_CARE_PROVIDER_SITE_OTHER): Payer: PRIVATE HEALTH INSURANCE

## 2011-02-18 ENCOUNTER — Ambulatory Visit (AMBULATORY_SURGERY_CENTER): Payer: PRIVATE HEALTH INSURANCE | Admitting: *Deleted

## 2011-02-18 DIAGNOSIS — Z0000 Encounter for general adult medical examination without abnormal findings: Secondary | ICD-10-CM

## 2011-02-18 DIAGNOSIS — Z1211 Encounter for screening for malignant neoplasm of colon: Secondary | ICD-10-CM

## 2011-02-18 DIAGNOSIS — Z800 Family history of malignant neoplasm of digestive organs: Secondary | ICD-10-CM

## 2011-02-18 LAB — HEPATIC FUNCTION PANEL
Bilirubin, Direct: 0.1 mg/dL (ref 0.0–0.3)
Total Bilirubin: 0.6 mg/dL (ref 0.3–1.2)

## 2011-02-18 LAB — LIPID PANEL
HDL: 39.1 mg/dL (ref >39.00)
LDL Cholesterol: 92 mg/dL (ref 0–99)
Total CHOL/HDL Ratio: 4
Triglycerides: 95 mg/dL (ref 0.0–149.0)
VLDL: 19 mg/dL (ref 0.0–40.0)

## 2011-02-18 LAB — URINALYSIS, ROUTINE W REFLEX MICROSCOPIC
Bilirubin Urine: NEGATIVE
Ketones, ur: NEGATIVE
Leukocytes, UA: NEGATIVE
Urine Glucose: NEGATIVE
Urobilinogen, UA: 0.2 (ref 0.0–1.0)

## 2011-02-18 LAB — CBC WITH DIFFERENTIAL/PLATELET
Basophils Relative: 0.5 % (ref 0.0–3.0)
Eosinophils Relative: 2.2 % (ref 0.0–5.0)
Lymphocytes Relative: 32.9 % (ref 12.0–46.0)
MCV: 85.1 fl (ref 78.0–100.0)
Monocytes Relative: 9 % (ref 3.0–12.0)
Neutrophils Relative %: 55.4 % (ref 43.0–77.0)
RBC: 5.26 Mil/uL (ref 4.22–5.81)
WBC: 5.6 10*3/uL (ref 4.5–10.5)

## 2011-02-18 LAB — BASIC METABOLIC PANEL
Calcium: 8.9 mg/dL (ref 8.4–10.5)
Creatinine, Ser: 1 mg/dL (ref 0.4–1.5)

## 2011-02-18 LAB — PSA: PSA: 5.05 ng/mL — ABNORMAL HIGH (ref 0.10–4.00)

## 2011-02-18 LAB — TSH: TSH: 1.68 u[IU]/mL (ref 0.35–5.50)

## 2011-02-18 MED ORDER — SUPREP BOWEL PREP KIT 17.5-3.13-1.6 GM/177ML PO SOLN: ORAL | Status: DC

## 2011-02-22 ENCOUNTER — Other Ambulatory Visit: Payer: Self-pay | Admitting: Internal Medicine

## 2011-03-08 ENCOUNTER — Encounter: Payer: Self-pay | Admitting: Internal Medicine

## 2011-03-08 ENCOUNTER — Ambulatory Visit (AMBULATORY_SURGERY_CENTER): Payer: PRIVATE HEALTH INSURANCE | Admitting: Internal Medicine

## 2011-03-08 VITALS — BP 112/74 | HR 67 | Temp 97.0°F | Resp 18 | Ht 74.0 in | Wt 192.0 lb

## 2011-03-08 DIAGNOSIS — D129 Benign neoplasm of anus and anal canal: Secondary | ICD-10-CM

## 2011-03-08 DIAGNOSIS — D126 Benign neoplasm of colon, unspecified: Secondary | ICD-10-CM

## 2011-03-08 DIAGNOSIS — Z800 Family history of malignant neoplasm of digestive organs: Secondary | ICD-10-CM

## 2011-03-08 DIAGNOSIS — Z1211 Encounter for screening for malignant neoplasm of colon: Secondary | ICD-10-CM

## 2011-03-08 DIAGNOSIS — D128 Benign neoplasm of rectum: Secondary | ICD-10-CM

## 2011-03-08 MED ORDER — SODIUM CHLORIDE 0.9 % IV SOLN: 500.0000 mL | INTRAVENOUS | Status: DC

## 2011-03-08 NOTE — Patient Instructions (Addendum)
Two small polyps were removed. They look benign. I will let you know in a letter but I anticipate recommending a routine repeat colonoscopy in 5 years (2017). Iva Boop, MD, [facility] Discharge instructions given with verbal understanding. Handout on polyps given. Resume previous medications.

## 2011-03-11 ENCOUNTER — Telehealth: Payer: Self-pay | Admitting: *Deleted

## 2011-03-11 NOTE — Telephone Encounter (Signed)

## 2011-03-14 ENCOUNTER — Encounter: Payer: Self-pay | Admitting: Internal Medicine

## 2011-03-21 ENCOUNTER — Telehealth: Payer: Self-pay

## 2011-03-21 DIAGNOSIS — R972 Elevated prostate specific antigen [PSA]: Secondary | ICD-10-CM

## 2011-03-21 DIAGNOSIS — N400 Enlarged prostate without lower urinary tract symptoms: Secondary | ICD-10-CM

## 2011-03-21 NOTE — Telephone Encounter (Signed)
All normal except the PSA has increased from 4.29 last yr, to current 5.05;  Does he see urology;  With the PSA increased I think he should see urology  Zella Ball to ask pt and let me know

## 2011-03-21 NOTE — Telephone Encounter (Signed)
Patient requesting lab results from lab work in August.

## 2011-03-22 NOTE — Telephone Encounter (Signed)
The only urology group in town is [facility];  Will refer there, but he can check with his ins to make sure is in network

## 2011-03-22 NOTE — Telephone Encounter (Signed)
Patient informed and will call back with name of MD in network

## 2011-03-22 NOTE — Telephone Encounter (Signed)
Called the patient he does not have a urologist. He will check with his insurance to find one that is in his network and will definitely call back and give [place] the name as he wants to see one.

## 2011-09-19 ENCOUNTER — Telehealth: Payer: Self-pay | Admitting: Internal Medicine

## 2011-09-19 NOTE — Telephone Encounter (Signed)
I have left a voicemail with the results and mailed a copy of the letter to the patient

## 2011-10-28 ENCOUNTER — Encounter: Payer: Self-pay | Admitting: Internal Medicine

## 2011-10-28 DIAGNOSIS — Z0000 Encounter for general adult medical examination without abnormal findings: Secondary | ICD-10-CM | POA: Insufficient documentation

## 2011-10-28 DIAGNOSIS — Z0001 Encounter for general adult medical examination with abnormal findings: Secondary | ICD-10-CM | POA: Insufficient documentation

## 2011-10-31 ENCOUNTER — Ambulatory Visit (INDEPENDENT_AMBULATORY_CARE_PROVIDER_SITE_OTHER)
Admission: RE | Admit: 2011-10-31 | Discharge: 2011-10-31 | Disposition: A | Discharge status: Home / Self Care | Payer: PRIVATE HEALTH INSURANCE | Source: Ambulatory Visit | Attending: Internal Medicine | Admitting: Internal Medicine

## 2011-10-31 ENCOUNTER — Encounter: Payer: Self-pay | Admitting: Internal Medicine

## 2011-10-31 ENCOUNTER — Ambulatory Visit (INDEPENDENT_AMBULATORY_CARE_PROVIDER_SITE_OTHER): Payer: PRIVATE HEALTH INSURANCE | Admitting: Internal Medicine

## 2011-10-31 VITALS — BP 114/68 | HR 68 | Temp 97.3°F | Ht 74.0 in | Wt 194.5 lb

## 2011-10-31 DIAGNOSIS — M25562 Pain in left knee: Secondary | ICD-10-CM

## 2011-10-31 DIAGNOSIS — I10 Essential (primary) hypertension: Secondary | ICD-10-CM

## 2011-10-31 DIAGNOSIS — F411 Generalized anxiety disorder: Secondary | ICD-10-CM

## 2011-10-31 DIAGNOSIS — M25561 Pain in right knee: Secondary | ICD-10-CM

## 2011-10-31 DIAGNOSIS — M25569 Pain in unspecified knee: Secondary | ICD-10-CM

## 2011-10-31 MED ORDER — CELECOXIB 200 MG PO CAPS: 200.0000 mg | ORAL_CAPSULE | Freq: Two times a day (BID) | ORAL | Status: AC | PRN

## 2011-10-31 NOTE — Patient Instructions (Signed)
Take all new medications as prescribed Continue all other medications as before Please go to XRAY in the Basement for the x-ray test for both knees You will be contacted regarding the referral for: MRI for the left and right knees

## 2011-10-31 NOTE — Progress Notes (Signed)
Subjective:    Patient ID: James Mullen, male    DOB: Feb 27, 1951, 61 y.o.   MRN: [id number]  HPI  Here to f/u with c/o bilat knee pain now severe, limps to walk both legs  Has hx of bilat DJD and left knee arthroscopy remotely, and still remains active with skiing and snowboarding though he gave up basketball after the last knee surgury.  Now with pain right > left, no effusion but severe, worse to stand or walk, with clicks and crunchinig bilat. Denies trauma, swelling, gout hx , fever, wt loss,  or falls.  No recent MRI or surgical eval.  Pt denies chest pain, increased sob or doe, wheezing, orthopnea, PND, increased LE swelling, palpitations, dizziness or syncope.  Pt denies new neurological symptoms such as new headache, or facial or extremity weakness or numbness   Pt denies polydipsia, polyuria.  Saw urology, rec'd for biopsy but he has deferred so far, is thinking about seeing a program in [place] that eval's prostate without biopsy.   Pt denies fever, wt loss, night sweats, loss of appetite, or other constitutional symptoms  Denies worsening depressive symptoms, suicidal ideation, or panic. Past Medical History  Diagnosis Date  . ANEMIA-NOS 07/08/2008  . ANXIETY 07/08/2008  . BENIGN PROSTATIC HYPERTROPHY 07/08/2008  . GERD 07/08/2008  . HYPERTENSION 07/08/2008  . 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 PERIPHERAL VASCULAR DISEASE 07/08/2008  . PSA, INCREASED 07/08/2008  . INSOMNIA-SLEEP [facility] 07/08/2008   Past Surgical History  Procedure Date  . Colonoscopy multiple, last 03/08/2011    up until 2012: no adenomas 2012: 2 diminutive polyps  . Thoracoabdominal aortic aneurysm repair 1997  . Uvulopalatopharyngoplasty   . Iliac artery aneurysm repair 2007    reports that he has never smoked. He has never used smokeless tobacco. He reports that he drinks about 1.2 ounces of alcohol per week. He reports that he does not use illicit drugs. family history includes Colon cancer (age of onset:43) in his  father. Allergies  Allergen Reactions  . Penicillins Hives   Current Outpatient Prescriptions on File Prior to Visit  Medication Sig Dispense Refill  . aspirin 81 MG tablet Take 81 mg by mouth daily.        Marland Kitchen DISCONTD: atenolol (TENORMIN) 50 MG tablet TAKE 1 TABLET BY MOUTH ONCE DAILY  90 tablet  2  . meclizine (ANTIVERT) 12.5 MG tablet Take 12.5 mg by mouth 3 (three) times daily as needed.        . zolpidem (AMBIEN CR) 12.5 MG CR tablet Take 1 tablet (12.5 mg total) by mouth at bedtime as needed for sleep.  30 tablet  0   Review of Systems Review of Systems  Constitutional: Negative for diaphoresis and unexpected weight change.  Eyes: Negative for photophobia and visual disturbance.  Respiratory: Negative for choking and stridor.   Gastrointestinal: Negative for vomiting and blood in stool.  Genitourinary: Negative for hematuria and decreased urine volume.   Skin: Negative for color change and wound.  Neurological: Negative for tremors and numbness.  Psychiatric/Behavioral: Negative for decreased concentration. The patient is not hyperactive.       Objective:   Physical Exam BP 114/68  Pulse 68  Temp(Src) 97.3 F (36.3 C) (Oral)  Ht 6\' 2"  (1.88 m)  Wt 194 lb 8 oz (88.225 kg)  BMI 24.97 kg/m2  SpO2 97% Physical Exam  VS noted, not ill appearing Constitutional: Pt appears well-developed and well-nourished.  HENT: Head: Normocephalic.  Right Ear: External  ear normal.  Left Ear: External ear normal.  Eyes: Conjunctivae and EOM are normal. Pupils are equal, round, and reactive to light.  Neck: Normal range of motion. Neck supple.  Cardiovascular: Normal rate and regular rhythm.   Pulmonary/Chest: Effort normal and breath sounds normal.  Left and right knee with bilat crepitus with click on right, small effusions Neurological: Pt is alert. No cranial nerve deficit. motor/dtr intact Skin: Skin is warm. No erythema.  Psychiatric: Pt behavior is normal. Thought content normal.  1+ nervous    Assessment & Plan:

## 2011-11-03 ENCOUNTER — Encounter: Payer: Self-pay | Admitting: Internal Medicine

## 2011-11-03 MED ORDER — ATENOLOL 50 MG PO TABS: 50.0000 mg | ORAL_TABLET | Freq: Every day | ORAL | Status: DC

## 2011-11-03 NOTE — Assessment & Plan Note (Signed)
With recent falls with snowboarding , for plain film today, but also MRI r/o cartilage tear vs DJD flare  

## 2011-11-03 NOTE — Assessment & Plan Note (Signed)
With recent falls with snowboarding , for plain film today, but also MRI r/o cartilage tear vs DJD flare

## 2011-11-03 NOTE — Assessment & Plan Note (Signed)
stable overall by hx and exam, most recent data reviewed with pt, and pt to continue medical treatment as before le  BP Readings from Last 3 Encounters:  10/31/11 114/68  03/08/11 112/74  12/25/10 110/68

## 2011-11-03 NOTE — Assessment & Plan Note (Signed)
stable overall by hx and exam, most recent data reviewed with pt, and pt to continue medical treatment as before Lab Results  Component Value Date   WBC 5.6 02/18/2011   HGB 15.1 02/18/2011   HCT 44.8 02/18/2011   PLT 148.0* 02/18/2011   GLUCOSE 101* 02/18/2011   CHOL 150 02/18/2011   TRIG 95.0 02/18/2011   HDL 39.10 02/18/2011   LDLDIRECT 87.7 11/14/2009   LDLCALC 92 02/18/2011   ALT 20 02/18/2011   AST 22 02/18/2011   NA 139 02/18/2011   K 4.3 02/18/2011   CL 104 02/18/2011   CREATININE 1.0 02/18/2011   BUN 15 02/18/2011   CO2 29 02/18/2011   TSH 1.68 02/18/2011   PSA 5.05* 02/18/2011

## 2012-01-07 ENCOUNTER — Other Ambulatory Visit: Payer: Self-pay

## 2012-01-07 MED ORDER — ZOLPIDEM TARTRATE ER 12.5 MG PO TBCR: 12.5000 mg | EXTENDED_RELEASE_TABLET | Freq: Every evening | ORAL | Status: DC | PRN

## 2012-01-07 NOTE — Telephone Encounter (Signed)
Done hardcopy to robin  

## 2012-01-07 NOTE — Telephone Encounter (Signed)
Faxed hardcopy to pharmacy. 

## 2012-01-07 NOTE — Telephone Encounter (Signed)
Pharmacy requesting refill on Zolpidem tart ER 12.5 please advise

## 2012-04-24 ENCOUNTER — Other Ambulatory Visit: Payer: Self-pay | Admitting: *Deleted

## 2012-04-24 DIAGNOSIS — I714 Abdominal aortic aneurysm, without rupture: Secondary | ICD-10-CM

## 2012-04-24 DIAGNOSIS — Z48812 Encounter for surgical aftercare following surgery on the circulatory system: Secondary | ICD-10-CM

## 2012-04-29 ENCOUNTER — Encounter: Payer: Self-pay | Admitting: Vascular Surgery

## 2012-04-30 ENCOUNTER — Ambulatory Visit (INDEPENDENT_AMBULATORY_CARE_PROVIDER_SITE_OTHER): Payer: PRIVATE HEALTH INSURANCE | Admitting: Vascular Surgery

## 2012-04-30 ENCOUNTER — Encounter: Payer: Self-pay | Admitting: Vascular Surgery

## 2012-04-30 VITALS — BP 115/76 | HR 56 | Resp 16 | Ht 74.0 in | Wt 190.2 lb

## 2012-04-30 DIAGNOSIS — I712 Thoracic aortic aneurysm, without rupture, unspecified: Secondary | ICD-10-CM

## 2012-04-30 DIAGNOSIS — I723 Aneurysm of iliac artery: Secondary | ICD-10-CM

## 2012-04-30 DIAGNOSIS — I714 Abdominal aortic aneurysm, without rupture, unspecified: Secondary | ICD-10-CM

## 2012-04-30 DIAGNOSIS — Z48812 Encounter for surgical aftercare following surgery on the circulatory system: Secondary | ICD-10-CM

## 2012-04-30 DIAGNOSIS — I724 Aneurysm of artery of lower extremity: Secondary | ICD-10-CM

## 2012-04-30 NOTE — Progress Notes (Signed)
[facility] [place] HISTORY AND PHYSICAL   History of Present Illness:  Patient is a 61 y.o. year old male who presents for follow-up evaluation of AAA.  The patient denies new abdominal or back pain.  The patient has some hypertension, degenerative arthritis and elevated PSA otherwise no new problems.  These are all currently stable and followed by his primary care physician Dr Jonny Ruiz.  The patient returns for follow-up today. He was previously a patient of Dr. Madilyn Fireman who had been seeing him for previous thoracoabdominal aneurysm as well as an infrarenal abdominal aortic aneurysm and iliac aneurysm  repair. He was last seen in 2011. He is very active with outdoor activities including canoeing and walking. I reviewed his previous operative note today which shows that the aorta was replaced from his descending thoracic aorta just distal to the left subclavian vein down to the visceral segment including a visceral patch and then subsequently he has undergone an infrarenal abdominal aortic aneurysm repair with an aortobi-iliac graft. The aortobi-iliac graft was in 2008. The thoracoabdominal aneurysm was in 1997. He had a CT angio of the chest in 2011. 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  Past Medical History  Diagnosis Date  . ANEMIA-NOS 07/08/2008  . ANXIETY 07/08/2008  . BENIGN PROSTATIC HYPERTROPHY 07/08/2008  . GERD 07/08/2008  . HYPERTENSION 07/08/2008  . 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 PERIPHERAL VASCULAR DISEASE 07/08/2008  . PSA, INCREASED 07/08/2008  . INSOMNIA-SLEEP [facility] 07/08/2008    Past Surgical History  Procedure Date  . Colonoscopy multiple, last 03/08/2011    up until 2012: no adenomas 2012: 2 diminutive polyps  . Thoracoabdominal aortic aneurysm repair 1997  . Uvulopalatopharyngoplasty   . Iliac artery aneurysm repair 2007    Review of Systems:  Neurologic: denies symptoms of TIA, amaurosis, or stroke Cardiac:denies shortness of breath or chest  pain Pulmonary: denies cough or wheeze  Social History History  Substance Use Topics  . Smoking status: Never Smoker   . Smokeless tobacco: Never Used  . Alcohol Use: 1.2 oz/week    1 Glasses of wine, 1 Cans of beer per week    Allergies  Allergies  Allergen Reactions  . Penicillins Hives     Current Outpatient Prescriptions  Medication Sig Dispense Refill  . atenolol (TENORMIN) 50 MG tablet Take 1 tablet (50 mg total) by mouth daily.  90 tablet  3  . aspirin 81 MG tablet Take 81 mg by mouth daily.        . meclizine (ANTIVERT) 12.5 MG tablet Take 12.5 mg by mouth 3 (three) times daily as needed.        . zolpidem (AMBIEN CR) 12.5 MG CR tablet Take 1 tablet (12.5 mg total) by mouth at bedtime as needed for sleep.  30 tablet  0  . zolpidem (AMBIEN CR) 12.5 MG CR tablet Take 1 tablet (12.5 mg total) by mouth at bedtime as needed for sleep.  30 tablet  5    Physical Examination  Filed Vitals:   04/30/12 0935  BP: 115/76  Pulse: 56  Resp: 16  Height: 6\' 2"  (1.88 m)  Weight: 190 lb 3.2 oz (86.274 kg)  SpO2: 100%    Body mass index is 24.42 kg/(m^2).  General:  Alert and oriented, no acute distress HEENT: Normal Neck: No bruit or JVD Pulmonary: Clear to auscultation bilaterally Cardiac: Regular Rate and Rhythm without murmur Abdomen: Soft, non-tender, non-distended, normal bowel sounds, no pulsatile  mass Extremities: 1+ popliteal pulse, 2+ femoral pulses  DATA: Patient had an aortic ultrasound today which shows his aortic diameter is currently 3.7 cm. The external iliac arteries were ectatic and approximately 2 cm in diameter   ASSESSMENT: Stable aortoiliac aneurysm repair.   PLAN:  1.  The patient had several questions today regarding whether or not he should continue his aspirin or his atenolol. He apparently was placed on the atenolol to decrease stress on his aorta by Dr. Donata Clay and Dr. Madilyn Fireman several years ago. I told him he should discuss this further with  Dr. Jonny Ruiz his primary care physician. He may have better options as far as his hypertension is concerned.  #2 the patient will have a CT angiogram of the chest abdomen and pelvis in one year to recheck his thoracic graft, a descending aorta and thoracoabdominal repair if this is stable we will probably arrange followup once every 5 years   Fabienne Bruns, MD [facility] [place] Office: [phone] Pager: [phone]

## 2012-04-30 NOTE — Progress Notes (Signed)
Abdominal aorta/iliac duplex performed @ [facility] 04/30/2012

## 2012-04-30 NOTE — Addendum Note (Signed)
Addended by: Sharee Pimple on: 04/30/2012 12:20 PM   Modules accepted: Orders

## 2012-12-05 ENCOUNTER — Other Ambulatory Visit: Payer: Self-pay | Admitting: Internal Medicine

## 2013-03-12 ENCOUNTER — Other Ambulatory Visit: Payer: Self-pay | Admitting: Internal Medicine

## 2013-03-15 ENCOUNTER — Other Ambulatory Visit: Payer: Self-pay | Admitting: Internal Medicine

## 2013-03-30 ENCOUNTER — Encounter: Payer: Self-pay | Admitting: Internal Medicine

## 2013-03-30 ENCOUNTER — Other Ambulatory Visit (INDEPENDENT_AMBULATORY_CARE_PROVIDER_SITE_OTHER): Payer: PRIVATE HEALTH INSURANCE

## 2013-03-30 ENCOUNTER — Ambulatory Visit (INDEPENDENT_AMBULATORY_CARE_PROVIDER_SITE_OTHER): Payer: PRIVATE HEALTH INSURANCE | Admitting: Internal Medicine

## 2013-03-30 VITALS — BP 112/72 | HR 89 | Temp 98.3°F | Ht 74.0 in | Wt 191.0 lb

## 2013-03-30 DIAGNOSIS — Z0000 Encounter for general adult medical examination without abnormal findings: Secondary | ICD-10-CM

## 2013-03-30 DIAGNOSIS — R972 Elevated prostate specific antigen [PSA]: Secondary | ICD-10-CM

## 2013-03-30 LAB — CBC WITH DIFFERENTIAL/PLATELET
Basophils Absolute: 0 10*3/uL (ref 0.0–0.1)
Basophils Relative: 0.5 % (ref 0.0–3.0)
Eosinophils Relative: 1.3 % (ref 0.0–5.0)
HCT: 44 % (ref 39.0–52.0)
Hemoglobin: 14.9 g/dL (ref 13.0–17.0)
Lymphocytes Relative: 33 % (ref 12.0–46.0)
Lymphs Abs: 1.8 10*3/uL (ref 0.7–4.0)
Monocytes Relative: 9.3 % (ref 3.0–12.0)
Neutro Abs: 3.1 10*3/uL (ref 1.4–7.7)
Neutrophils Relative %: 55.9 % (ref 43.0–77.0)
Platelets: 171 10*3/uL (ref 150.0–400.0)
RDW: 14.1 % (ref 11.5–14.6)
WBC: 5.5 10*3/uL (ref 4.5–10.5)

## 2013-03-30 LAB — URINALYSIS, ROUTINE W REFLEX MICROSCOPIC
Ketones, ur: NEGATIVE
Leukocytes, UA: NEGATIVE
Specific Gravity, Urine: >=1.03 (ref 1.000–1.030)
Urine Glucose: NEGATIVE
Urobilinogen, UA: 0.2 (ref 0.0–1.0)
pH: 6 (ref 5.0–8.0)

## 2013-03-30 MED ORDER — ATENOLOL 50 MG PO TABS: 50.0000 mg | ORAL_TABLET | Freq: Every day | ORAL | Status: DC

## 2013-03-30 NOTE — Assessment & Plan Note (Signed)
May need referral back to [facility] urology if increased

## 2013-03-30 NOTE — Patient Instructions (Addendum)
Please continue all other medications as before, and refills have been done if requested  - the tenormin Please have the pharmacy call with any other refills you may need. Please continue your efforts at being more active, low cholesterol diet, and weight control. You are otherwise up to date with prevention measures today. Please keep your appointments with your specialists as you may have planned Please go to the LAB in the Basement (turn left off the elevator) for the tests to be done today You will be contacted by phone if any changes need to be made immediately.  Otherwise, you will receive a letter about your results with an explanation, but please check with MyChart first.  Please remember to sign up for My Chart if you have not done so, as this will be important to you in the future with finding out test results, communicating by private email, and scheduling acute appointments online when needed.  Please return in 1 year for your yearly visit, or sooner if needed

## 2013-03-30 NOTE — Progress Notes (Signed)
Subjective:    Patient ID: James Mullen, male    DOB: 1951/05/02, 62 y.o.   MRN: [id number]  HPI Here for wellness and f/u;  Overall doing ok;  Pt denies CP, worsening SOB, DOE, wheezing, orthopnea, PND, worsening LE edema, palpitations, dizziness or syncope.  Pt denies neurological change such as new headache, facial or extremity weakness.  Pt denies polydipsia, polyuria, or low sugar symptoms. Pt states overall good compliance with treatment and medications, good tolerability, and has been trying to follow lower cholesterol diet.  Pt denies worsening depressive symptoms, suicidal ideation or panic. No fever, night sweats, wt loss, loss of appetite, or other constitutional symptoms.  Pt states good ability with ADL's, has low fall risk, home safety reviewed and adequate, no other significant changes in hearing or vision, and only occasionally active with exercise. Has seen urology in [facility], rec'd biopsy prostate but pt deferred.  For PSA f/u today. 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 Past Medical History  Diagnosis Date  . ANEMIA-NOS 07/08/2008  . ANXIETY 07/08/2008  . BENIGN PROSTATIC HYPERTROPHY 07/08/2008  . GERD 07/08/2008  . HYPERTENSION 07/08/2008  . 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 PERIPHERAL VASCULAR DISEASE 07/08/2008  . PSA, INCREASED 07/08/2008  . INSOMNIA-SLEEP [facility] 07/08/2008   Past Surgical History  Procedure Laterality Date  . Colonoscopy  multiple, last 03/08/2011    up until 2012: no adenomas 2012: 2 diminutive polyps  . Thoracoabdominal aortic aneurysm repair  1997  . Uvulopalatopharyngoplasty    . Iliac artery aneurysm repair  2007    reports that he has never smoked. He has never used smokeless tobacco. He reports that he drinks about 1.2 ounces of alcohol per week. He reports that he does not use illicit drugs. family history includes  Colon cancer (age of onset: 78) in his father. Allergies  Allergen Reactions  . Penicillins Hives   Current Outpatient Prescriptions on File Prior to Visit  Medication Sig Dispense Refill  . aspirin 81 MG tablet Take 81 mg by mouth daily.        . meclizine (ANTIVERT) 12.5 MG tablet Take 12.5 mg by mouth 3 (three) times daily as needed.        . zolpidem (AMBIEN CR) 12.5 MG CR tablet Take 1 tablet (12.5 mg total) by mouth at bedtime as needed for sleep.  30 tablet  0  . zolpidem (AMBIEN CR) 12.5 MG CR tablet Take 1 tablet (12.5 mg total) by mouth at bedtime as needed for sleep.  30 tablet  5   No current facility-administered medications on file prior to visit.    Review of Systems Constitutional: Negative for diaphoresis, activity change, appetite change or unexpected weight change.  HENT: Negative for hearing loss, ear pain, facial swelling, mouth sores and neck stiffness.   Eyes: Negative for pain, redness and visual disturbance.  Respiratory: Negative for shortness of breath and wheezing.   Cardiovascular: Negative for chest pain and palpitations.  Gastrointestinal: Negative for diarrhea, blood in stool, abdominal distention or other pain Genitourinary: Negative for hematuria, flank pain or change in urine volume.  Musculoskeletal: Negative for myalgias and joint swelling.  Skin: Negative for color change and wound.  Neurological: Negative for syncope and numbness. other than noted Hematological: Negative for adenopathy.  Psychiatric/Behavioral: Negative for hallucinations, self-injury, decreased concentration and agitation.  Objective:   Physical Exam BP 112/72  Pulse 89  Temp(Src) 98.3 F (36.8 C) (Oral)  Ht 6\' 2"  (1.88 m)  Wt 191 lb (86.637 kg)  BMI 24.51 kg/m2  SpO2 96% VS noted,  Constitutional: Pt is oriented to person, place, and time. Appears well-developed and well-nourished.  Head: Normocephalic and atraumatic.  Right Ear: External ear normal.  Left Ear:  External ear normal.  Nose: Nose normal.  Mouth/Throat: Oropharynx is clear and moist.  Eyes: Conjunctivae and EOM are normal. Pupils are equal, round, and reactive to light.  Neck: Normal range of motion. Neck supple. No JVD present. No tracheal deviation present.  Cardiovascular: Normal rate, regular rhythm, normal heart sounds and intact distal pulses.   Pulmonary/Chest: Effort normal and breath sounds normal.  Abdominal: Soft. Bowel sounds are normal. There is no tenderness. No HSM  Musculoskeletal: Normal range of motion. Exhibits no edema.  Lymphadenopathy:  Has no cervical adenopathy.  Neurological: Pt is alert and oriented to person, place, and time. Pt has normal reflexes. No cranial nerve deficit.  Skin: Skin is warm and dry. No rash noted.  Psychiatric:  Has  normal mood and affect. Behavior is normal.     Assessment & Plan:

## 2013-03-30 NOTE — Assessment & Plan Note (Signed)

## 2013-03-31 ENCOUNTER — Encounter: Payer: Self-pay | Admitting: Internal Medicine

## 2013-03-31 LAB — HEPATIC FUNCTION PANEL
AST: 19 U/L (ref 0–37)
Albumin: 4.1 g/dL (ref 3.5–5.2)
Total Protein: 6.8 g/dL (ref 6.0–8.3)

## 2013-03-31 LAB — LIPID PANEL
Cholesterol: 165 mg/dL (ref 0–200)
HDL: 38.3 mg/dL — ABNORMAL LOW (ref >39.00)
Total CHOL/HDL Ratio: 4
Triglycerides: 122 mg/dL (ref 0.0–149.0)
VLDL: 24.4 mg/dL (ref 0.0–40.0)

## 2013-03-31 LAB — BASIC METABOLIC PANEL
BUN: 11 mg/dL (ref 6–23)
CO2: 28 mEq/L (ref 19–32)
Calcium: 8.8 mg/dL (ref 8.4–10.5)
Chloride: 108 mEq/L (ref 96–112)
Creatinine, Ser: 0.9 mg/dL (ref 0.4–1.5)
GFR: 97.34 mL/min (ref >60.00)
Glucose, Bld: 95 mg/dL (ref 70–99)
Potassium: 4.2 mEq/L (ref 3.5–5.1)
Sodium: 140 mEq/L (ref 135–145)

## 2013-03-31 LAB — TSH: TSH: 1.51 u[IU]/mL (ref 0.35–5.50)

## 2013-05-06 ENCOUNTER — Ambulatory Visit: Payer: PRIVATE HEALTH INSURANCE | Admitting: Vascular Surgery

## 2013-05-06 ENCOUNTER — Other Ambulatory Visit: Payer: PRIVATE HEALTH INSURANCE

## 2013-05-12 ENCOUNTER — Other Ambulatory Visit: Payer: Self-pay | Admitting: *Deleted

## 2013-05-12 ENCOUNTER — Encounter: Payer: Self-pay | Admitting: Vascular Surgery

## 2013-05-12 DIAGNOSIS — I714 Abdominal aortic aneurysm, without rupture, unspecified: Secondary | ICD-10-CM

## 2013-05-12 DIAGNOSIS — Z48812 Encounter for surgical aftercare following surgery on the circulatory system: Secondary | ICD-10-CM

## 2013-05-13 ENCOUNTER — Other Ambulatory Visit: Payer: PRIVATE HEALTH INSURANCE

## 2013-05-13 ENCOUNTER — Ambulatory Visit: Payer: PRIVATE HEALTH INSURANCE | Admitting: Vascular Surgery

## 2013-05-13 ENCOUNTER — Inpatient Hospital Stay: Admission: RE | Admit: 2013-05-13 | Payer: PRIVATE HEALTH INSURANCE | Source: Ambulatory Visit

## 2013-08-31 ENCOUNTER — Other Ambulatory Visit: Payer: Self-pay | Admitting: *Deleted

## 2013-08-31 DIAGNOSIS — I716 Thoracoabdominal aortic aneurysm, without rupture, unspecified: Secondary | ICD-10-CM

## 2013-09-29 ENCOUNTER — Other Ambulatory Visit: Payer: Self-pay | Admitting: Vascular Surgery

## 2013-09-29 ENCOUNTER — Encounter: Payer: Self-pay | Admitting: Vascular Surgery

## 2013-09-29 LAB — BUN: BUN: 20 mg/dL (ref 6–23)

## 2013-09-29 LAB — CREATININE, SERUM: Creat: 0.86 mg/dL (ref 0.50–1.35)

## 2013-09-30 ENCOUNTER — Ambulatory Visit
Admission: RE | Admit: 2013-09-30 | Discharge: 2013-09-30 | Disposition: A | Discharge status: Home / Self Care | Payer: PRIVATE HEALTH INSURANCE | Source: Ambulatory Visit | Attending: Vascular Surgery | Admitting: Vascular Surgery

## 2013-09-30 ENCOUNTER — Ambulatory Visit (INDEPENDENT_AMBULATORY_CARE_PROVIDER_SITE_OTHER): Payer: PRIVATE HEALTH INSURANCE | Admitting: Vascular Surgery

## 2013-09-30 ENCOUNTER — Encounter: Payer: Self-pay | Admitting: Vascular Surgery

## 2013-09-30 ENCOUNTER — Encounter (INDEPENDENT_AMBULATORY_CARE_PROVIDER_SITE_OTHER): Payer: Self-pay

## 2013-09-30 VITALS — Ht 74.0 in | Wt 197.8 lb

## 2013-09-30 DIAGNOSIS — I716 Thoracoabdominal aortic aneurysm, without rupture, unspecified: Secondary | ICD-10-CM

## 2013-09-30 MED ORDER — IOHEXOL 350 MG/ML SOLN
80.0000 mL | Freq: Once | INTRAVENOUS | Status: AC | PRN
  Administered 2013-09-30: 80 mL via INTRAVENOUS

## 2013-09-30 NOTE — Progress Notes (Signed)
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 He subsequently had infrarenal abdominal aortic aneurysm repair in 2008. He has done well from both of these. He returns today for followup. He really has no new medical problems. He continues to take his atenolol.  Past Medical History  Diagnosis Date  . ANEMIA-NOS 07/08/2008  . ANXIETY 07/08/2008  . BENIGN PROSTATIC HYPERTROPHY 07/08/2008  . GERD 07/08/2008  . HYPERTENSION 07/08/2008  . 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 PERIPHERAL VASCULAR DISEASE 07/08/2008  . PSA, INCREASED 07/08/2008  . INSOMNIA-SLEEP [facility] 07/08/2008    Past Surgical History  Procedure Laterality Date  . Colonoscopy  multiple, last 03/08/2011    up until 2012: no adenomas 2012: 2 diminutive polyps  . Thoracoabdominal aortic aneurysm repair  1997  . Uvulopalatopharyngoplasty    . Iliac artery aneurysm repair  2007    Review of systems: He denies shortness of breath. He denies chest pain.  Physical exam:  Filed Vitals:   09/30/13 1221  Height: 6\' 2"  (1.88 m)  Weight: 197 lb 12.8 oz (89.721 kg)    Neck: No carotid bruits  Chest: Clear to auscultation bilaterally  Cardiac: Regular rate and rhythm without murmur  Abdomen: Soft nontender well-healed incision no evidence of hernia no palpable pulsatile mass  Extremities: 2+ femoral pulses bilaterally  Data: CT angiogram of the chest abdomen and pelvis is reviewed today. This shows a slight dilation of the ascending aorta 3.1 cm. This is stable from his CT scan several years ago. He thoracoabdominal aneurysm repair is intact with no evidence of patch aneurysm. The infrarenal abdominal aortic aneurysm is also intact with no evidence of perianastomotic pseudoaneurysm.  Assessment: Doing well status post thoracoabdominal an infrarenal abdominal aortic aneurysm repair.  Plan: Followup CT Angio chest abdomen and pelvis in 5 years.  Ruta Hinds,  MD [facility] [place] Office: [phone] Pager: [phone]

## 2013-12-07 ENCOUNTER — Telehealth: Payer: Self-pay | Admitting: Internal Medicine

## 2013-12-07 MED ORDER — ZOLPIDEM TARTRATE ER 12.5 MG PO TBCR
12.5000 mg | EXTENDED_RELEASE_TABLET | Freq: Every evening | ORAL | Status: DC | PRN
Start: 2013-12-07 — End: 2014-04-01

## 2013-12-07 NOTE — Telephone Encounter (Signed)
Patient called and states that he needs a refill on his Ambien rx. He is going out of town and wants to know if he can have enough to last him for the next two weeks. He says he will schedule a follow-up appointment when he returns. Please advise.

## 2013-12-07 NOTE — Telephone Encounter (Signed)
Faxed hardcopy to [facility] [place] and called the patient left detailed message script sent in.

## 2013-12-07 NOTE — Telephone Encounter (Signed)
Done hardcopy to robin  

## 2014-04-01 ENCOUNTER — Ambulatory Visit (INDEPENDENT_AMBULATORY_CARE_PROVIDER_SITE_OTHER): Payer: PRIVATE HEALTH INSURANCE | Admitting: Internal Medicine

## 2014-04-01 ENCOUNTER — Other Ambulatory Visit (INDEPENDENT_AMBULATORY_CARE_PROVIDER_SITE_OTHER): Payer: PRIVATE HEALTH INSURANCE

## 2014-04-01 ENCOUNTER — Encounter: Payer: Self-pay | Admitting: Internal Medicine

## 2014-04-01 VITALS — BP 104/62 | HR 71 | Temp 97.8°F | Ht 74.0 in | Wt 202.5 lb

## 2014-04-01 DIAGNOSIS — I10 Essential (primary) hypertension: Secondary | ICD-10-CM

## 2014-04-01 DIAGNOSIS — Z0000 Encounter for general adult medical examination without abnormal findings: Secondary | ICD-10-CM

## 2014-04-01 DIAGNOSIS — M1712 Unilateral primary osteoarthritis, left knee: Secondary | ICD-10-CM

## 2014-04-01 DIAGNOSIS — G4733 Obstructive sleep apnea (adult) (pediatric): Secondary | ICD-10-CM | POA: Insufficient documentation

## 2014-04-01 LAB — CBC WITH DIFFERENTIAL/PLATELET
BASOS ABS: 0 10*3/uL (ref 0.0–0.1)
Basophils Relative: 0.4 % (ref 0.0–3.0)
Eosinophils Absolute: 0.1 10*3/uL (ref 0.0–0.7)
Eosinophils Relative: 1 % (ref 0.0–5.0)
HCT: 44.3 % (ref 39.0–52.0)
Hemoglobin: 15.1 g/dL (ref 13.0–17.0)
LYMPHS PCT: 34.5 % (ref 12.0–46.0)
Lymphs Abs: 2.2 10*3/uL (ref 0.7–4.0)
MCHC: 34.1 g/dL (ref 30.0–36.0)
MCV: 83.2 fl (ref 78.0–100.0)
Monocytes Absolute: 0.7 10*3/uL (ref 0.1–1.0)
Monocytes Relative: 11.2 % (ref 3.0–12.0)
Neutro Abs: 3.4 10*3/uL (ref 1.4–7.7)
Neutrophils Relative %: 52.9 % (ref 43.0–77.0)
Platelets: 166 10*3/uL (ref 150.0–400.0)
RBC: 5.32 Mil/uL (ref 4.22–5.81)
RDW: 13.7 % (ref 11.5–15.5)
WBC: 6.4 10*3/uL (ref 4.0–10.5)

## 2014-04-01 LAB — BASIC METABOLIC PANEL WITH GFR
BUN: 12 mg/dL (ref 6–23)
CO2: 32 meq/L (ref 19–32)
Calcium: 9 mg/dL (ref 8.4–10.5)
Chloride: 103 meq/L (ref 96–112)
Creatinine, Ser: 0.9 mg/dL (ref 0.4–1.5)
GFR: 92 mL/min (ref >60.00)
Glucose, Bld: 82 mg/dL (ref 70–99)
Potassium: 3.9 meq/L (ref 3.5–5.1)
Sodium: 138 meq/L (ref 135–145)

## 2014-04-01 LAB — URINALYSIS, ROUTINE W REFLEX MICROSCOPIC
BILIRUBIN URINE: NEGATIVE
HGB URINE DIPSTICK: NEGATIVE
Ketones, ur: NEGATIVE
Leukocytes, UA: NEGATIVE
NITRITE: NEGATIVE
Specific Gravity, Urine: 1.01 (ref 1.000–1.030)
TOTAL PROTEIN, URINE-UPE24: NEGATIVE
Urine Glucose: NEGATIVE
Urobilinogen, UA: 0.2 (ref 0.0–1.0)
pH: 7.5 (ref 5.0–8.0)

## 2014-04-01 LAB — LIPID PANEL
Cholesterol: 171 mg/dL (ref 0–200)
HDL: 36.9 mg/dL — ABNORMAL LOW (ref >39.00)
NonHDL: 134.1
TRIGLYCERIDES: 242 mg/dL — AB (ref 0.0–149.0)
Total CHOL/HDL Ratio: 5
VLDL: 48.4 mg/dL — ABNORMAL HIGH (ref 0.0–40.0)

## 2014-04-01 LAB — LDL CHOLESTEROL, DIRECT: Direct LDL: 100.2 mg/dL

## 2014-04-01 LAB — PSA: PSA: 5.07 ng/mL — ABNORMAL HIGH (ref 0.10–4.00)

## 2014-04-01 LAB — HEPATIC FUNCTION PANEL
ALT: 20 U/L (ref 0–53)
AST: 20 U/L (ref 0–37)
Albumin: 4.1 g/dL (ref 3.5–5.2)
Alkaline Phosphatase: 63 U/L (ref 39–117)
Bilirubin, Direct: 0.1 mg/dL (ref 0.0–0.3)
Total Bilirubin: 0.6 mg/dL (ref 0.2–1.2)
Total Protein: 7.3 g/dL (ref 6.0–8.3)

## 2014-04-01 LAB — TSH: TSH: 2.06 u[IU]/mL (ref 0.35–4.50)

## 2014-04-01 MED ORDER — ZOLPIDEM TARTRATE ER 12.5 MG PO TBCR: 12.5000 mg | EXTENDED_RELEASE_TABLET | Freq: Every evening | ORAL | Status: DC | PRN

## 2014-04-01 NOTE — Patient Instructions (Addendum)
Please continue all other medications as before, and refills have been done if requested - the ambien  Please have the pharmacy call with any other refills you may need.  Please continue your efforts at being more active, low cholesterol diet, and weight control.  You are otherwise up to date with prevention measures today.  Please keep your appointments with your specialists as you may have planned  Please make an appointment with Dr Smith/sports medicine, for the knees at your convenience  You will be contacted regarding the referral for: Pulmonary, for the sleep apnea  Please go to the LAB in the Basement (turn left off the elevator) for the tests to be done today  You will be contacted by phone if any changes need to be made immediately.  Otherwise, you will receive a letter about your results with an explanation, but please check with MyChart first.  Please remember to sign up for MyChart if you have not done so, as this will be important to you in the future with finding out test results, communicating by private email, and scheduling acute appointments online when needed.  Please return in 1 year for your yearly visit, or sooner if needed, with Lab testing done 3-5 days before

## 2014-04-01 NOTE — Progress Notes (Signed)
Subjective:    Patient ID: James Mullen, male    DOB: 12/31/1950, 63 y.o.   MRN: [id number]  HPI  Here for wellness and f/u;  Overall doing ok;  Pt denies CP, worsening SOB, DOE, wheezing, orthopnea, PND, worsening LE edema, palpitations, dizziness or syncope.  Pt denies neurological change such as new headache, facial or extremity weakness.  Pt denies polydipsia, polyuria, or low sugar symptoms. Pt states overall good compliance with treatment and medications, good tolerability, and has been trying to follow lower cholesterol diet.  Pt denies worsening depressive symptoms, suicidal ideation or panic. No fever, night sweats, wt loss, loss of appetite, or other constitutional symptoms.  Pt states good ability with ADL's, has low fall risk, home safety reviewed and adequate, no other significant changes in hearing or vision, and only occasionally active with exercise.  Due for bilat knee TKR eventually. Still races dirt bikes occasionally twice per month. Needs OSA f/u appt.   Past Medical History  Diagnosis Date  . ANEMIA-NOS 07/08/2008  . ANXIETY 07/08/2008  . BENIGN PROSTATIC HYPERTROPHY 07/08/2008  . GERD 07/08/2008  . HYPERTENSION 07/08/2008  . 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 PERIPHERAL VASCULAR DISEASE 07/08/2008  . PSA, INCREASED 07/08/2008  . INSOMNIA-SLEEP [facility] 07/08/2008   Past Surgical History  Procedure Laterality Date  . Colonoscopy  multiple, last 03/08/2011    up until 2012: no adenomas 2012: 2 diminutive polyps  . Thoracoabdominal aortic aneurysm repair  1997  . Uvulopalatopharyngoplasty    . Iliac artery aneurysm repair  2007    reports that he has never smoked. He has never used smokeless tobacco. He reports that he drinks about 1.2 ounces of alcohol per week. He reports that he does not use illicit drugs. family history includes Colon cancer (age of onset: 19) in his father. Allergies  Allergen Reactions  . Penicillins Hives   Current Outpatient Prescriptions on File Prior  to Visit  Medication Sig Dispense Refill  . atenolol (TENORMIN) 50 MG tablet Take 1 tablet (50 mg total) by mouth daily.  30 tablet  11  . meclizine (ANTIVERT) 12.5 MG tablet Take 12.5 mg by mouth 3 (three) times daily as needed.        . zolpidem (AMBIEN CR) 12.5 MG CR tablet Take 1 tablet (12.5 mg total) by mouth at bedtime as needed for sleep.  30 tablet  0   No current facility-administered medications on file prior to visit.    Review of Systems Constitutional: Negative for increased diaphoresis, other activity, appetite or other siginficant weight change  HENT: Negative for worsening hearing loss, ear pain, facial swelling, mouth sores and neck stiffness.   Eyes: Negative for other worsening pain, redness or visual disturbance.  Respiratory: Negative for shortness of breath and wheezing.   Cardiovascular: Negative for chest pain and palpitations.  Gastrointestinal: Negative for diarrhea, blood in stool, abdominal distention or other pain Genitourinary: Negative for hematuria, flank pain or change in urine volume.  Musculoskeletal: Negative for myalgias or other joint complaints.  Skin: Negative for color change and wound.  Neurological: Negative for syncope and numbness. other than noted Hematological: Negative for adenopathy. or other swelling Psychiatric/Behavioral: Negative for hallucinations, self-injury, decreased concentration or other worsening agitation.      Objective:   Physical Exam BP 104/62  Pulse 71  Temp(Src) 97.8 F (36.6 C) (Oral)  Ht 6\' 2"  (1.88 m)  Wt 202 lb 8 oz (91.853 kg)  BMI 25.99 kg/m2  SpO2 97% VS  noted,  Constitutional: Pt is oriented to person, place, and time. Appears well-developed and well-nourished.  Head: Normocephalic and atraumatic.  Right Ear: External ear normal.  Left Ear: External ear normal.  Nose: Nose normal.  Mouth/Throat: Oropharynx is clear and moist.  Eyes: Conjunctivae and EOM are normal. Pupils are equal, round, and  reactive to light.  Neck: Normal range of motion. Neck supple. No JVD present. No tracheal deviation present.  Cardiovascular: Normal rate, regular rhythm, normal heart sounds and intact distal pulses.   Pulmonary/Chest: Effort normal and breath sounds without rales or wheezing  Abdominal: Soft. Bowel sounds are normal. NT. No HSM  Musculoskeletal: Normal range of motion. Exhibits no edema.  Bilat knees with crepitus, left effusion, NT Lymphadenopathy:  Has no cervical adenopathy.  Neurological: Pt is alert and oriented to person, place, and time. Pt has normal reflexes. No cranial nerve deficit. Motor grossly intact Skin: Skin is warm and dry. No rash noted.  Psychiatric:  Has normal mood and affect. Behavior is normal.      Assessment & Plan:

## 2014-04-01 NOTE — Progress Notes (Signed)
Pre visit review using our clinic review tool, if applicable. No additional management support is needed unless otherwise documented below in the visit note. 

## 2014-04-03 NOTE — Assessment & Plan Note (Signed)

## 2014-04-03 NOTE — Assessment & Plan Note (Signed)
For pulm referral 

## 2014-04-03 NOTE — Assessment & Plan Note (Signed)
With bilat knee pain ongoing, to see Dr Tamala Julian, sport med

## 2014-04-05 ENCOUNTER — Encounter: Payer: Self-pay | Admitting: Internal Medicine

## 2014-04-25 ENCOUNTER — Institutional Professional Consult (permissible substitution): Payer: PRIVATE HEALTH INSURANCE | Admitting: Pulmonary Disease

## 2014-06-01 ENCOUNTER — Institutional Professional Consult (permissible substitution): Payer: PRIVATE HEALTH INSURANCE | Admitting: Pulmonary Disease

## 2014-06-23 ENCOUNTER — Other Ambulatory Visit: Payer: Self-pay

## 2014-06-23 MED ORDER — ATENOLOL 50 MG PO TABS: 50.0000 mg | ORAL_TABLET | Freq: Every day | ORAL | Status: DC

## 2014-07-06 ENCOUNTER — Encounter (INDEPENDENT_AMBULATORY_CARE_PROVIDER_SITE_OTHER): Payer: Self-pay

## 2014-07-06 ENCOUNTER — Ambulatory Visit (INDEPENDENT_AMBULATORY_CARE_PROVIDER_SITE_OTHER): Payer: PRIVATE HEALTH INSURANCE | Admitting: Pulmonary Disease

## 2014-07-06 ENCOUNTER — Encounter: Payer: Self-pay | Admitting: Pulmonary Disease

## 2014-07-06 ENCOUNTER — Institutional Professional Consult (permissible substitution): Payer: PRIVATE HEALTH INSURANCE | Admitting: Pulmonary Disease

## 2014-07-06 VITALS — BP 124/74 | HR 71 | Temp 97.0°F | Ht 74.0 in | Wt 201.6 lb

## 2014-07-06 DIAGNOSIS — G4700 Insomnia, unspecified: Secondary | ICD-10-CM | POA: Insufficient documentation

## 2014-07-06 NOTE — Progress Notes (Signed)
Subjective:    Patient ID: James Mullen, male    DOB: 02/24/1951, 64 y.o.   MRN: [id number]  HPI The patient is a 64 year old male who I've been asked to see for possible obstructive sleep apnea. He tells me that he had a sleep study approximately 30 years ago that was positive, and had upper airway surgery which resulted in improved sleep. He was having further symptoms approximately 20 years ago, and underwent a sleep study and was told that it was normal. Currently, the patient states that he has frequent awakenings, and is not rested in the mornings upon arising. He has been noted to have loud snoring, but no one has ever mentioned witnessed apneas. He notes significant sleep pressure during the day with inactivity, but he stays very active for the most part. He will also doze in the evenings trying to watch television or movies. He also notes sleepiness driving longer distances. The patient states that his weight is up 10 pounds over the last 2 years, and his Epworth score today is 8   Sleep Questionnaire What time do you typically go to bed?( Between what hours) 10p-12a 10p-12a at 1529 on 07/06/14 by Inge Rise, CMA How long does it take you to fall asleep? 10 min 10 min at 1529 on 07/06/14 by Inge Rise, CMA How many times during the night do you wake up? 4 4 at 1529 on 07/06/14 by Inge Rise, [place] What time do you get out of bed to start your day? 0600 0600 at 1529 on 07/06/14 by Inge Rise, CMA Do you drive or operate heavy machinery in your occupation? No No at 1529 on 07/06/14 by Inge Rise, CMA How much has your weight changed (up or down) over the past two years? (In pounds) 10 lb (4.536 kg) 10 lb (4.536 kg) at 1529 on 07/06/14 by Inge Rise, CMA Have you ever had a sleep study before? Yes Yes at 1529 on 07/06/14 by Inge Rise, CMA If yes, location of study[place] [facility] at 1529 on 07/06/14 by Inge Rise, CMA If yes, date of study? 2 tests done. 1  done about 30 years ago and 2nd done 20 years ago. 2 tests done. 1 done about 30 years ago and 2nd done 20 years ago. at 1529 on 07/06/14 by Inge Rise, CMA Do you currently use CPAP? No No at 1529 on 07/06/14 by Inge Rise, CMA Do you wear oxygen at any time? No No at 1529 on 07/06/14 by Inge Rise, CMA   Review of Systems  Constitutional: Negative for fever and unexpected weight change.  HENT: Negative for congestion, dental problem, ear pain, nosebleeds, postnasal drip, rhinorrhea, sinus pressure, sneezing, sore throat and trouble swallowing.   Eyes: Negative for redness and itching.  Respiratory: Negative for cough, chest tightness, shortness of breath and wheezing.   Cardiovascular: Negative for palpitations and leg swelling.  Gastrointestinal: Negative for nausea and vomiting.  Genitourinary: Negative for dysuria.  Musculoskeletal: Negative for joint swelling.  Skin: Negative for rash.  Neurological: Negative for headaches.  Hematological: Does not bruise/bleed easily.  Psychiatric/Behavioral: Negative for dysphoric mood. The patient is not nervous/anxious.        Objective:   Physical Exam Constitutional:  Well developed, no acute distress  HENT:  Nares patent without discharge, deviated septum to left with narrowing.  Oropharynx without exudate, palate and uvula are trimmed.  Eyes:  Perrla, eomi, no scleral  icterus  Neck:  No JVD, no TMG  Cardiovascular:  Normal rate, regular rhythm, no rubs or gallops.  No murmurs        Intact distal pulses  Pulmonary :  Normal breath sounds, no stridor or respiratory distress   No rales, rhonchi, or wheezing  Abdominal:  Soft, nondistended, bowel sounds present.  No tenderness noted.   Musculoskeletal:  No lower extremity edema noted.  Lymph Nodes:  No cervical lymphadenopathy noted  Skin:  No cyanosis noted  Neurologic:  Alert, appropriate, moves all 4 extremities without obvious deficit.           Assessment & Plan:

## 2014-07-06 NOTE — Assessment & Plan Note (Signed)
The patient is having frequent awakenings at night and nonrestorative sleep, and also has a history of snoring. He relates definite sleep pressure during the day and evening with periods of inactivity. This is all worrisome for possible sleep-disordered breathing, although there are many other sleep disorders which can also cause frequent awakenings. His history is really not suggestive of true insomnia. The patient will need a sleep study in order to workup his issues, and he is agreeable.

## 2014-07-06 NOTE — Patient Instructions (Signed)
Will schedule for sleep study, and arrange followup once the results are available.   

## 2014-07-19 ENCOUNTER — Encounter: Payer: Self-pay | Admitting: Internal Medicine

## 2014-07-19 ENCOUNTER — Encounter: Payer: Self-pay | Admitting: Gastroenterology

## 2014-09-19 ENCOUNTER — Ambulatory Visit (HOSPITAL_BASED_OUTPATIENT_CLINIC_OR_DEPARTMENT_OTHER): Payer: PRIVATE HEALTH INSURANCE | Attending: Pulmonary Disease

## 2014-09-19 VITALS — Ht 74.0 in | Wt 190.0 lb

## 2014-09-19 DIAGNOSIS — G4700 Insomnia, unspecified: Secondary | ICD-10-CM | POA: Insufficient documentation

## 2014-09-22 ENCOUNTER — Telehealth: Payer: Self-pay | Admitting: Pulmonary Disease

## 2014-09-22 DIAGNOSIS — G4733 Obstructive sleep apnea (adult) (pediatric): Secondary | ICD-10-CM | POA: Diagnosis not present

## 2014-09-22 NOTE — Telephone Encounter (Signed)
Pt needs ov to review sleep study 

## 2014-09-22 NOTE — Progress Notes (Signed)
See phone note sent 3/25

## 2014-09-22 NOTE — Sleep Study (Signed)
   NAME: James Mullen DATE OF BIRTH:  01-Jan-1951 MEDICAL RECORD NUMBER [id number]  LOCATION: [facility]  PHYSICIAN: [place] OF STUDY: 09/19/2014  SLEEP STUDY TYPE: Nocturnal Polysomnogram               REFERRING PHYSICIAN: Clance, Armando Reichert, MD  INDICATION FOR STUDY: Hypersomnia with sleep apnea  EPWORTH SLEEPINESS SCORE:  15 HEIGHT: 6\' 2"  (188 cm)  WEIGHT: 190 lb (86.183 kg)    Body mass index is 24.38 kg/(m^2).  NECK SIZE: 16.5 in.  MEDICATIONS: Reviewed in the sleep record  SLEEP ARCHITECTURE: The patient had a total sleep time of 327 minutes with no slow-wave sleep and adequate quantity of REM. Sleep onset latency was normal as was REM onset, and sleep efficiency was only mildly reduced.  RESPIRATORY DATA: The patient was found to have 3 apneas and 75 obstructive hypopneas, giving him an AHI of 14 events per hour. The events occurred primarily in the supine position, and there was moderate snoring noted throughout.  OXYGEN DATA: There was transient oxygen desaturation as low as 85% with the patient's obstructive events  CARDIAC DATA: Occasional PAC and PVC noted  MOVEMENT/PARASOMNIA: No significant periodic limb movements or other abnormal behaviors were noted.  IMPRESSION/ RECOMMENDATION:    1) mild obstructive sleep apnea/hypopnea syndrome, with an AHI of 14 events per hour and oxygen desaturation as low as 85%. For this degree of sleep apnea can include a trial of weight loss alone, upper airway surgery, dental appliance, and also C Pap. Nuchal correlation is suggested.  2) occasional PAC and PVC noted.    [place], American Board of Sleep Medicine  ELECTRONICALLY SIGNED ON:  09/22/2014, 12:47 PM [place] PH: [phone]   FX: [phone] [place]

## 2014-09-22 NOTE — Progress Notes (Signed)
Pt needs ov to review sleep study 

## 2014-09-22 NOTE — Telephone Encounter (Signed)
lmomtcb x1 for pt 

## 2014-09-26 NOTE — Telephone Encounter (Signed)
Called pt and appt scheduled for 3/29 at 10:30

## 2014-09-27 ENCOUNTER — Encounter: Payer: Self-pay | Admitting: Pulmonary Disease

## 2014-09-27 ENCOUNTER — Ambulatory Visit (INDEPENDENT_AMBULATORY_CARE_PROVIDER_SITE_OTHER): Payer: PRIVATE HEALTH INSURANCE | Admitting: Pulmonary Disease

## 2014-09-27 VITALS — BP 110/62 | HR 65 | Temp 98.0°F | Ht 74.0 in | Wt 206.4 lb

## 2014-09-27 DIAGNOSIS — G4733 Obstructive sleep apnea (adult) (pediatric): Secondary | ICD-10-CM

## 2014-09-27 NOTE — Assessment & Plan Note (Signed)
The patient has mild obstructive sleep apnea by his recent sleep study, and I have reviewed the results with him in detail. This does not represent a significant risk to his cardiovascular health, and therefore the decision to treat this aggressively should depend upon its impact to his quality of life. I have outlined a conservative approach with a trial of weight loss alone as well as positional therapy. I have also outlined a more aggressive path with either a dental appliance or CPAP. The patient would like to consider his options, but more than likely will work on weight loss and positional therapy first.

## 2014-09-27 NOTE — Progress Notes (Signed)
   Subjective:    Patient ID: James Mullen, male    DOB: 25-May-1951, 64 y.o.   MRN: [id number]  HPI The patient comes in today for follow-up of his recent sleep study. He was found to have mild obstructive sleep apnea, with an AHI of 14 events per hour and transient oxygen desaturation as low as 85%. His events occurred primarily in the supine position. I have reviewed the study with him in detail, and answered all of his questions.   Review of Systems  Constitutional: Negative for fever and unexpected weight change.  HENT: Negative for congestion, dental problem, ear pain, nosebleeds, postnasal drip, rhinorrhea, sinus pressure, sneezing, sore throat and trouble swallowing.   Eyes: Negative for redness and itching.  Respiratory: Negative for cough, chest tightness, shortness of breath and wheezing.   Cardiovascular: Negative for palpitations and leg swelling.  Gastrointestinal: Negative for nausea and vomiting.  Genitourinary: Negative for dysuria.  Musculoskeletal: Negative for joint swelling.  Skin: Negative for rash.  Neurological: Negative for headaches.  Hematological: Does not bruise/bleed easily.  Psychiatric/Behavioral: Negative for dysphoric mood. The patient is not nervous/anxious.        Objective:   Physical Exam Overweight male in no acute distress Nose without purulence or discharge noted Neck without lymphadenopathy or thyromegaly Lower extremities without edema, no cyanosis Alert and oriented, moves all 4 extremities.       Assessment & Plan:

## 2014-09-27 NOTE — Patient Instructions (Signed)
Work on weight reduction, as well as avoiding sleeping on your back Please call if you would like to consider a dental appliance or cpap followup with me as needed.

## 2015-09-11 ENCOUNTER — Other Ambulatory Visit: Payer: Self-pay | Admitting: Internal Medicine

## 2016-04-10 ENCOUNTER — Encounter: Payer: Self-pay | Admitting: Internal Medicine

## 2016-08-23 ENCOUNTER — Encounter: Payer: Self-pay | Admitting: Internal Medicine

## 2016-09-20 ENCOUNTER — Ambulatory Visit (AMBULATORY_SURGERY_CENTER): Payer: Self-pay

## 2016-09-20 VITALS — Ht 74.0 in | Wt 198.8 lb

## 2016-09-20 DIAGNOSIS — Z800 Family history of malignant neoplasm of digestive organs: Secondary | ICD-10-CM

## 2016-09-20 NOTE — Progress Notes (Signed)
Denies allergies to eggs or soy products. Denies complication of anesthesia or sedation. Denies use of weight loss medication. Denies use of O2.   Emmi instructions declined.  

## 2016-10-01 ENCOUNTER — Encounter: Payer: Self-pay | Admitting: Internal Medicine

## 2016-10-01 ENCOUNTER — Ambulatory Visit (AMBULATORY_SURGERY_CENTER): Payer: PRIVATE HEALTH INSURANCE | Admitting: Internal Medicine

## 2016-10-01 VITALS — BP 112/67 | HR 74 | Temp 98.6°F | Resp 12 | Ht 74.0 in | Wt 206.0 lb

## 2016-10-01 DIAGNOSIS — Z1211 Encounter for screening for malignant neoplasm of colon: Secondary | ICD-10-CM | POA: Diagnosis not present

## 2016-10-01 DIAGNOSIS — Z1212 Encounter for screening for malignant neoplasm of rectum: Secondary | ICD-10-CM

## 2016-10-01 DIAGNOSIS — Z800 Family history of malignant neoplasm of digestive organs: Secondary | ICD-10-CM

## 2016-10-01 MED ORDER — SODIUM CHLORIDE 0.9 % IV SOLN: 500.0000 mL | INTRAVENOUS | Status: DC

## 2016-10-01 NOTE — Progress Notes (Signed)
Spontaneous respirations throughout. VSS. Resting comfortably. To PACU on room air. Report to  [facility].

## 2016-10-01 NOTE — Op Note (Signed)
[place] Patient Name: James Mullen Procedure Date: 10/01/2016 2:57 PM MRN: [id number] Endoscopist: Gatha Mayer , MD Age: 66 Referring MD:  Date of Birth: Jul 07, 1950 Gender: Male Account #: [id number] Procedure:                Colonoscopy Indications:              Screening in patient at increased risk: Colorectal                            cancer in father before age 16 Medicines:                Propofol per Anesthesia, Monitored Anesthesia Care Procedure:                Pre-Anesthesia Assessment:                           - Prior to the procedure, a History and Physical                            was performed, and patient medications and                            allergies were reviewed. The patient's tolerance of                            previous anesthesia was also reviewed. The risks                            and benefits of the procedure and the sedation                            options and risks were discussed with the patient.                            All questions were answered, and informed consent                            was obtained. Prior Anticoagulants: The patient has                            taken no previous anticoagulant or antiplatelet                            agents. ASA Grade Assessment: II - A patient with                            mild systemic disease. After reviewing the risks                            and benefits, the patient was deemed in                            satisfactory condition to undergo the procedure.  After obtaining informed consent, the colonoscope                            was passed under direct vision. Throughout the                            procedure, the patient's blood pressure, pulse, and                            oxygen saturations were monitored continuously. The                            Colonoscope was introduced through the anus and   advanced to the the cecum, identified by                            appendiceal orifice and ileocecal valve. The                            colonoscopy was performed without difficulty. The                            patient tolerated the procedure well. The quality                            of the bowel preparation was excellent. The                            ileocecal valve, appendiceal orifice, and rectum                            were photographed. Scope In: 3:02:49 PM Scope Out: 3:16:33 PM Scope Withdrawal Time: 0 hours 10 minutes 20 seconds  Total Procedure Duration: 0 hours 13 minutes 44 seconds  Findings:                 The perianal and digital rectal examinations were                            normal. Pertinent negatives include normal prostate                            (size, shape, and consistency).                           The entire examined colon appeared normal on direct                            and retroflexion views. Complications:            No immediate complications. Estimated Blood Loss:     Estimated blood loss: none. Impression:               - The entire examined colon is normal on direct and  retroflexion views.                           - No specimens collected. Recommendation:           - Patient has a contact number available for                            emergencies. The signs and symptoms of potential                            delayed complications were discussed with the                            patient. Return to normal activities tomorrow.                            Written discharge instructions were provided to the                            patient.                           - Resume previous diet.                           - Continue present medications.                           - Repeat colonoscopy in 5 years for screening                            purposes. Gatha Mayer, MD 10/01/2016 3:19:50 PM This  report has been signed electronically.

## 2016-10-01 NOTE — Progress Notes (Signed)
Pt's states no medical or surgical changes since previsit or office visit. 

## 2016-10-01 NOTE — Patient Instructions (Addendum)
   No polyps again!  Next routine colonoscopy or other screening test in 10 years - 2028  I appreciate the opportunity to care for you. Carl E. Gessner, MD, FACG   YOU HAD AN ENDOSCOPIC PROCEDURE TODAY: Refer to the procedure report and other information in the discharge instructions given to you for any specific questions about what was found during the examination. If this information does not answer your questions, please call  office at [phone] to clarify.   YOU SHOULD EXPECT: Some feelings of bloating in the abdomen. Passage of more gas than usual. Walking can help get rid of the air that was put into your GI tract during the procedure and reduce the bloating. If you had a lower endoscopy (such as a colonoscopy or flexible sigmoidoscopy) you may notice spotting of blood in your stool or on the toilet paper. Some abdominal soreness may be present for a day or two, also.  DIET: Your first meal following the procedure should be a light meal and then it is ok to progress to your normal diet. A half-sandwich or bowl of soup is an example of a good first meal. Heavy or fried foods are harder to digest and may make you feel nauseous or bloated. Drink plenty of fluids but you should avoid alcoholic beverages for 24 hours. If you had a esophageal dilation, please see attached instructions for diet.    ACTIVITY: Your care partner should take you home directly after the procedure. You should plan to take it easy, moving slowly for the rest of the day. You can resume normal activity the day after the procedure however YOU SHOULD NOT DRIVE, use power tools, machinery or perform tasks that involve climbing or major physical exertion for 24 hours (because of the sedation medicines used during the test).   SYMPTOMS TO REPORT IMMEDIATELY: A gastroenterologist can be reached at any hour. Please call [phone]  for any of the following symptoms:  Following lower endoscopy (colonoscopy,  flexible sigmoidoscopy) Excessive amounts of blood in the stool  Significant tenderness, worsening of abdominal pains  Swelling of the abdomen that is new, acute  Fever of 100 or higher    FOLLOW UP:  If any biopsies were taken you will be contacted by phone or by letter within the next 1-3 weeks. Call [phone]  if you have not heard about the biopsies in 3 weeks.  Please also call with any specific questions about appointments or follow up tests.  

## 2016-10-02 ENCOUNTER — Telehealth: Payer: Self-pay

## 2016-10-02 NOTE — Telephone Encounter (Signed)
  Follow up Call-  Call back number 10/01/2016  Post procedure Call Back phone  # [phone]  Permission to leave phone message Yes  Some recent data might be hidden     Patient questions:  Do you have a fever, pain , or abdominal swelling? No. Pain Score  0 *  Have you tolerated food without any problems? Yes.    Have you been able to return to your normal activities? Yes.    Do you have any questions about your discharge instructions: Diet   No. Medications  No. Follow up visit  No.  Do you have questions or concerns about your Care? No.  Actions: * If pain score is 4 or above: No action needed, pain <4.

## 2016-11-11 ENCOUNTER — Other Ambulatory Visit: Payer: Self-pay | Admitting: Internal Medicine

## 2016-11-26 ENCOUNTER — Telehealth: Payer: Self-pay | Admitting: Internal Medicine

## 2016-11-26 NOTE — Telephone Encounter (Signed)
Patient Name: James Mullen  DOB: 1950-08-22    Initial Comment Caller states thinks has kidney infection d/t stabbing back pain under rib cage;    Nurse Assessment  Nurse: Thad Ranger RN, Denise Date/Time (Eastern Time): 11/26/2016 3:09:26 PM  Confirm and document reason for call. If symptomatic, describe symptoms. ---Caller states thinks has kidney infection d/t stabbing back pain under rib cage. Has appt Thurs at 1530  Does the patient have any new or worsening symptoms? ---Yes  Will a triage be completed? ---Yes  Related visit to physician within the last 2 weeks? ---No  Does the PT have any chronic conditions? (i.e. diabetes, asthma, etc.) ---No  Is this a behavioral health or substance abuse call? ---No     Guidelines    Guideline Title Affirmed Question Affirmed Notes       Final Disposition User        Comments  Appt made with Dr Cathlean Cower, 11/27/16 at 1100am. Pt aware/agreeable. Advised to make aware of sched appt on Thurs at 1530 and may cancel unless Dr Jenny Reichmann wants him to keep this appt as well. Verb understanding.   Referrals  REFERRED TO PCP OFFICE

## 2016-11-27 ENCOUNTER — Encounter: Payer: Self-pay | Admitting: Internal Medicine

## 2016-11-27 ENCOUNTER — Other Ambulatory Visit (INDEPENDENT_AMBULATORY_CARE_PROVIDER_SITE_OTHER): Payer: PRIVATE HEALTH INSURANCE

## 2016-11-27 ENCOUNTER — Ambulatory Visit (INDEPENDENT_AMBULATORY_CARE_PROVIDER_SITE_OTHER): Payer: PRIVATE HEALTH INSURANCE | Admitting: Internal Medicine

## 2016-11-27 VITALS — BP 104/70 | HR 85 | Ht 74.0 in | Wt 199.0 lb

## 2016-11-27 DIAGNOSIS — I10 Essential (primary) hypertension: Secondary | ICD-10-CM | POA: Diagnosis not present

## 2016-11-27 DIAGNOSIS — Z1159 Encounter for screening for other viral diseases: Secondary | ICD-10-CM

## 2016-11-27 DIAGNOSIS — M545 Low back pain, unspecified: Secondary | ICD-10-CM

## 2016-11-27 DIAGNOSIS — M25562 Pain in left knee: Secondary | ICD-10-CM

## 2016-11-27 DIAGNOSIS — M25561 Pain in right knee: Secondary | ICD-10-CM

## 2016-11-27 DIAGNOSIS — Z0001 Encounter for general adult medical examination with abnormal findings: Secondary | ICD-10-CM

## 2016-11-27 DIAGNOSIS — R7989 Other specified abnormal findings of blood chemistry: Secondary | ICD-10-CM

## 2016-11-27 DIAGNOSIS — Z114 Encounter for screening for human immunodeficiency virus [HIV]: Secondary | ICD-10-CM

## 2016-11-27 DIAGNOSIS — I716 Thoracoabdominal aortic aneurysm, without rupture, unspecified: Secondary | ICD-10-CM

## 2016-11-27 LAB — BASIC METABOLIC PANEL
BUN: 10 mg/dL (ref 6–23)
CHLORIDE: 104 meq/L (ref 96–112)
CO2: 32 meq/L (ref 19–32)
Calcium: 9.2 mg/dL (ref 8.4–10.5)
Creatinine, Ser: 0.92 mg/dL (ref 0.40–1.50)
GFR: 87.8 mL/min (ref >60.00)
Glucose, Bld: 96 mg/dL (ref 70–99)
POTASSIUM: 4.2 meq/L (ref 3.5–5.1)
Sodium: 139 mEq/L (ref 135–145)

## 2016-11-27 LAB — URINALYSIS, ROUTINE W REFLEX MICROSCOPIC
Bilirubin Urine: NEGATIVE
Ketones, ur: NEGATIVE
Leukocytes, UA: NEGATIVE
Nitrite: NEGATIVE
Specific Gravity, Urine: 1.025
Total Protein, Urine: NEGATIVE
Urine Glucose: NEGATIVE
Urobilinogen, UA: 0.2
pH: 6 (ref 5.0–8.0)

## 2016-11-27 LAB — CBC WITH DIFFERENTIAL/PLATELET
Basophils Absolute: 0.1 K/uL (ref 0.0–0.1)
Basophils Relative: 1.1 % (ref 0.0–3.0)
Eosinophils Absolute: 0.1 K/uL (ref 0.0–0.7)
Eosinophils Relative: 0.8 % (ref 0.0–5.0)
HCT: 45.4 % (ref 39.0–52.0)
Hemoglobin: 15.5 g/dL (ref 13.0–17.0)
Lymphocytes Relative: 32.2 % (ref 12.0–46.0)
Lymphs Abs: 2.1 K/uL (ref 0.7–4.0)
MCHC: 34.2 g/dL (ref 30.0–36.0)
MCV: 83.1 fl (ref 78.0–100.0)
Monocytes Absolute: 0.6 K/uL (ref 0.1–1.0)
Monocytes Relative: 9.9 % (ref 3.0–12.0)
Neutro Abs: 3.6 K/uL (ref 1.4–7.7)
Neutrophils Relative %: 56 % (ref 43.0–77.0)
Platelets: 150 K/uL (ref 150.0–400.0)
RBC: 5.46 Mil/uL (ref 4.22–5.81)
RDW: 14.3 % (ref 11.5–15.5)
WBC: 6.4 K/uL (ref 4.0–10.5)

## 2016-11-27 LAB — HEPATIC FUNCTION PANEL
ALT: 20 U/L (ref 0–53)
AST: 18 U/L (ref 0–37)
Albumin: 4.4 g/dL (ref 3.5–5.2)
Alkaline Phosphatase: 58 U/L (ref 39–117)
Bilirubin, Direct: 0.1 mg/dL (ref 0.0–0.3)
Total Bilirubin: 0.6 mg/dL (ref 0.2–1.2)
Total Protein: 6.9 g/dL (ref 6.0–8.3)

## 2016-11-27 LAB — HIV ANTIBODY (ROUTINE TESTING W REFLEX): HIV 1&2 Ab, 4th Generation: NONREACTIVE

## 2016-11-27 LAB — LIPID PANEL
Cholesterol: 165 mg/dL (ref 0–200)
HDL: 39.1 mg/dL
NonHDL: 125.48
Total CHOL/HDL Ratio: 4
Triglycerides: 320 mg/dL — ABNORMAL HIGH (ref 0.0–149.0)
VLDL: 64 mg/dL — ABNORMAL HIGH (ref 0.0–40.0)

## 2016-11-27 LAB — PSA: PSA: 6.33 ng/mL — ABNORMAL HIGH (ref 0.10–4.00)

## 2016-11-27 LAB — TSH: TSH: 1.92 u[IU]/mL (ref 0.35–4.50)

## 2016-11-27 LAB — LDL CHOLESTEROL, DIRECT: Direct LDL: 98 mg/dL

## 2016-11-27 MED ORDER — MELOXICAM 15 MG PO TABS: 15.0000 mg | ORAL_TABLET | Freq: Every day | ORAL | 1 refills | Status: DC

## 2016-11-27 MED ORDER — CYCLOBENZAPRINE HCL 5 MG PO TABS: 5.0000 mg | ORAL_TABLET | Freq: Three times a day (TID) | ORAL | 1 refills | Status: DC | PRN

## 2016-11-27 MED ORDER — ASPIRIN EC 81 MG PO TBEC: 81.0000 mg | DELAYED_RELEASE_TABLET | Freq: Every day | ORAL | 11 refills | Status: DC

## 2016-11-27 MED ORDER — ATENOLOL 50 MG PO TABS: ORAL_TABLET | ORAL | 3 refills | Status: DC

## 2016-11-27 NOTE — Assessment & Plan Note (Signed)
Ongoing persistent, does not want surgury, prefers to see Dr Smith/sport med in this office

## 2016-11-27 NOTE — Assessment & Plan Note (Signed)
Asympt, consider vasc surgury f/u but declines for now

## 2016-11-27 NOTE — Assessment & Plan Note (Signed)

## 2016-11-27 NOTE — Assessment & Plan Note (Signed)
stable overall by history and exam, recent data reviewed with pt, and pt to continue medical treatment as before,  to f/u any worsening symptoms or concerns  

## 2016-11-27 NOTE — Assessment & Plan Note (Addendum)
C/w msk, for nsaid prn, muscle relaxer prn,  to f/u any worsening symptoms or concerns   In addition to the time spent performing CPE, I spent an additional 15 minutes face to face,in which greater than 50% of this time was spent in counseling and coordination of care for patient's illness as documented., including the differential dx, further evaluation and management of left lower back pain, knee pain, HTN and hx of aortic anurysm surgury

## 2016-11-27 NOTE — Patient Instructions (Signed)
Please take all new medication as prescribed - the anti-inflammatory medcation, and the muscle relaxer as needed  Please also start Aspirin 81 mg - (coated) - 1 per day to help reduce risk of heart disease and stroke  Please continue all other medications as before, and refills have been done if requested.  Please have the pharmacy call with any other refills you may need.  Please continue your efforts at being more active, low cholesterol diet, and weight control.  You are otherwise up to date with prevention measures today.  Please keep your appointments with your specialists as you may have planned  Please go to the LAB in the Basement (turn left off the elevator) for the tests to be done today  You will be contacted by phone if any changes need to be made immediately.  Otherwise, you will receive a letter about your results with an explanation, but please check with MyChart first.  Please remember to sign up for MyChart if you have not done so, as this will be important to you in the future with finding out test results, communicating by private email, and scheduling acute appointments online when needed.  Please return in 1 year for your yearly visit, or sooner if needed, with Lab testing done 3-5 days before

## 2016-11-27 NOTE — Progress Notes (Signed)
Subjective:    Patient ID: James Mullen, male    DOB: 27-Aug-1950, 66 y.o.   MRN: [id number]  HPI   Here for wellness and f/u;  Overall doing ok;  Pt denies Chest pain, worsening SOB, DOE, wheezing, orthopnea, PND, worsening LE edema, palpitations, dizziness or syncope.  Pt denies neurological change such as new headache, facial or extremity weakness.  Pt denies polydipsia, polyuria, or low sugar symptoms. Pt states overall good compliance with treatment and medications, good tolerability, and has been trying to follow appropriate diet.  Pt denies worsening depressive symptoms, suicidal ideation or panic. No fever, night sweats, wt loss, loss of appetite, or other constitutional symptoms.  Pt states good ability with ADL's, has low fall risk, home safety reviewed and adequate, no other significant changes in hearing or vision, and only occasionally active with exercise.  Also with left lower back pain, mild, constant yesterday AM, no raidation and Denies urinary symptoms such as dysuria, frequency, urgency, flank pain, hematuria or n/v, fever, chills.  Pt denies bowel or bladder change, fever, wt loss,  worsening LE pain/numbness/weakness, gait change or falls.  Also has bilat knee pain, with intermittent effusions but no giveaways or falls.  Denies urinary symptoms such as dysuria, frequency, urgency, flank pain, hematuria or n/v, fever, chills.  Denies worsening reflux, abd pain, dysphagia, n/v, bowel change or blood.  Past Medical History:  Diagnosis Date  . ANEMIA-NOS 07/08/2008  . ANXIETY 07/08/2008   Patient denies.  Marland Kitchen BENIGN PROSTATIC HYPERTROPHY 07/08/2008  . GERD 07/08/2008  . HYPERTENSION 07/08/2008  . INSOMNIA-SLEEP DISORDER-UNSPEC 07/08/2008  . 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 PERIPHERAL VASCULAR DISEASE 07/08/2008  . PSA, INCREASED 07/08/2008  . Sleep apnea    ok if patient sleeps on his side.   Past Surgical History:  Procedure Laterality Date  . COLONOSCOPY  multiple, last 03/08/2011   up  until 2012: no adenomas 2012: 2 diminutive polyps  . ILIAC ARTERY ANEURYSM REPAIR  2007  . POLYPECTOMY    . [place]  . UVULOPALATOPHARYNGOPLASTY  1988    reports that he has never smoked. He has never used smokeless tobacco. He reports that he drinks about 1.2 oz of alcohol per week . He reports that he does not use drugs. family history includes Colon cancer (age of onset: 68) in his father. Allergies  Allergen Reactions  . Penicillins Hives   Current Outpatient Prescriptions on File Prior to Visit  Medication Sig Dispense Refill  . Calcium Carbonate-Vitamin D (CALCIUM PLUS VITAMIN D PO) Take by mouth daily.    . Coenzyme Q10 (COQ10 PO) Take by mouth daily.    . Fish Oil OIL by Does not apply route daily.    . Glucosamine-Chondroitin-MSM 500-200-150 MG TABS Take 1 tablet by mouth daily.    . Melatonin 1 MG TABS Take 1 tablet by mouth as needed.    . Misc Natural Products (PROSTATE HEALTH PO) Take by mouth daily.    . Nutritional Supplements (DHEA PO) Take by mouth daily.     Current Facility-Administered Medications on File Prior to Visit  Medication Dose Route Frequency Provider Last Rate Last Dose  . 0.9 %  sodium chloride infusion  500 mL Intravenous Continuous Gatha Mayer, MD        Review of Systems Constitutional: Negative for other unusual diaphoresis, sweats, appetite or weight changes HENT: Negative for other worsening hearing loss, ear pain, facial swelling, mouth sores or neck stiffness.  Eyes: Negative for other worsening pain, redness or other visual disturbance.  Respiratory: Negative for other stridor or swelling Cardiovascular: Negative for other palpitations or other chest pain  Gastrointestinal: Negative for worsening diarrhea or loose stools, blood in stool, distention or other pain Genitourinary: Negative for hematuria, flank pain or other change in urine volume.  Musculoskeletal: Negative for myalgias or other joint  swelling.  Skin: Negative for other color change, or other wound or worsening drainage.  Neurological: Negative for other syncope or numbness. Hematological: Negative for other adenopathy or swelling Psychiatric/Behavioral: Negative for hallucinations, other worsening agitation, SI, self-injury, or new decreased concentration All other system neg per pt    Objective:   Physical Exam BP 104/70   Pulse 85   Ht 6\' 2"  (1.88 m)   Wt 199 lb (90.3 kg)   SpO2 99%   BMI 25.55 kg/m  VS noted,  Constitutional: Pt is oriented to person, place, and time. Appears well-developed and well-nourished, in no significant distress and comfortable Head: Normocephalic and atraumatic  Eyes: Conjunctivae and EOM are normal. Pupils are equal, round, and reactive to light Right Ear: External ear normal without discharge Left Ear: External ear normal without discharge Nose: Nose without discharge or deformity Mouth/Throat: Oropharynx is without other ulcerations and moist  Neck: Normal range of motion. Neck supple. No JVD present. No tracheal deviation present or significant neck LA or mass Cardiovascular: Normal rate, regular rhythm, normal heart sounds and intact distal pulses.   Pulmonary/Chest: WOB normal and breath sounds without rales or wheezing  Abdominal: Soft. Bowel sounds are normal. NT. No HSM  Musculoskeletal: Normal range of motion. Exhibits no edema Lymphadenopathy: Has no other cervical adenopathy.  Neurological: Pt is alert and oriented to person, place, and time. Pt has normal reflexes. No cranial nerve deficit. Motor grossly intact, Gait intact Skin: Skin is warm and dry. No rash noted or new ulcerations Psychiatric:  Has normal mood and affect. Behavior is normal without agitation No other exam findings Lab Results  Component Value Date   WBC 6.4 04/01/2014   HGB 15.1 04/01/2014   HCT 44.3 04/01/2014   PLT 166.0 04/01/2014   GLUCOSE 82 04/01/2014   CHOL 171 04/01/2014   TRIG 242.0  (H) 04/01/2014   HDL 36.90 (L) 04/01/2014   LDLDIRECT 100.2 04/01/2014   LDLCALC 102 (H) 03/30/2013   ALT 20 04/01/2014   AST 20 04/01/2014   NA 138 04/01/2014   K 3.9 04/01/2014   CL 103 04/01/2014   CREATININE 0.9 04/01/2014   BUN 12 04/01/2014   CO2 32 04/01/2014   TSH 2.06 04/01/2014   PSA 5.07 (H) 04/01/2014        Assessment & Plan:

## 2016-11-28 ENCOUNTER — Encounter: Payer: Self-pay | Admitting: Internal Medicine

## 2016-11-28 ENCOUNTER — Ambulatory Visit: Payer: PRIVATE HEALTH INSURANCE | Admitting: Internal Medicine

## 2016-11-28 LAB — HEPATITIS C ANTIBODY: HCV AB: NEGATIVE

## 2016-12-05 ENCOUNTER — Encounter: Payer: Self-pay | Admitting: Internal Medicine

## 2017-01-11 NOTE — Progress Notes (Signed)
[facility] [address], [place] [address] Phone: [phone] Subjective:    I'm seeing this patient by the request  of:  Biagio Borg, MD   CC: Bilateral knee pain  RDE:[id number]  James Mullen is a 66 y.o. male coming in with complaint of bilateral knee pain.patient has significant worsening severe knees. States. States that it can be painful enough that stops him from activities. Rates the severity of pain is 8 out of 10. Patient denies any significant falls does feel like he has some instability.positive swelling.Severity of pain 6 out of 10   patient did have x-rays taken back in 2015. These were independently visualized by me with both knees showing some mild to moderate degenerative changes  Past Medical History:  Diagnosis Date  . ANEMIA-NOS 07/08/2008  . ANXIETY 07/08/2008   Patient denies.  Marland Kitchen BENIGN PROSTATIC HYPERTROPHY 07/08/2008  . GERD 07/08/2008  . HYPERTENSION 07/08/2008  . INSOMNIA-SLEEP DISORDER-UNSPEC 07/08/2008  . 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 PERIPHERAL VASCULAR DISEASE 07/08/2008  . PSA, INCREASED 07/08/2008  . Sleep apnea    ok if patient sleeps on his side.   Past Surgical History:  Procedure Laterality Date  . COLONOSCOPY  multiple, last 03/08/2011   up until 2012: no adenomas 2012: 2 diminutive polyps  . ILIAC ARTERY ANEURYSM REPAIR  2007  . POLYPECTOMY    . [place]  . UVULOPALATOPHARYNGOPLASTY  1988   Social History   Social History  . Marital status: Single    Spouse name: N/A  . Number of children: 1  . Years of education: N/A   Occupational History  . CFO     Social History Main Topics  . Smoking status: Never Smoker  . Smokeless tobacco: Never Used  . Alcohol use 1.2 oz/week    1 Glasses of wine, 1 Cans of beer per week  . Drug use: No  . Sexual activity: Not Asked   Other Topics Concern  . None   Social History Narrative  . None   Allergies  Allergen  Reactions  . Penicillins Hives   Family History  Problem Relation Age of Onset  . Colon cancer Father 50  . Esophageal cancer Neg Hx   . Rectal cancer Neg Hx   . Stomach cancer Neg Hx     Past medical history, social, surgical and family history all reviewed in electronic medical record.  No pertanent information unless stated regarding to the chief complaint.   Review of Systems:Review of systems updated and as accurate as of 01/13/17  No headache, visual changes, nausea, vomiting, diarrhea, constipation, dizziness, abdominal pain, skin rash, fevers, chills, night sweats, weight loss, swollen lymph nodes, body aches, chest pain, shortness of breath, mood changes. Positive muscle aches and joint swelling  Objective  Blood pressure 104/68, pulse 81, height 6\' 2"  (1.88 m), weight 198 lb (89.8 kg), SpO2 98 %. Systems examined below as of 01/13/17   General: No apparent distress alert and oriented x3 mood and affect normal, dressed appropriately.  HEENT: Pupils equal, extraocular movements intact  Respiratory: Patient's speak in full sentences and does not appear short of breath  Cardiovascular: No lower extremity edema, non tender, no erythema  Skin: Warm dry intact with no signs of infection or rash on extremities or on axial skeleton.  Abdomen: Soft nontender  Neuro: Cranial nerves II through XII are intact, neurovascularly intact in all extremities with 2+ DTRs and  2+ pulses.  Lymph: No lymphadenopathy of posterior or anterior cervical chain or axillae bilaterally.  Gait mild antalgic gait  MSK:  Non tender with full range of motion and good stability and symmetric strength and tone of shoulders, elbows, wrist, hip, and ankles bilaterally.  Knee:bilateral valgus deformity noted. Large thigh to calf ratio.  Tender to palpation over medial and PF joint line. (left greater than right ROM full in flexion and extension and lower leg rotation. instability with valgus force. To left  greater than right painful patellar compression. Patellar glide with moderate crepitus. Patellar and quadriceps tendons unremarkable. Hamstring and quadriceps strength is normal.  MSK US performed of: bilateral This study was ordered, performed, and interpreted by Charlann Boxer D.O.  Knee: Significant narrowing of the medial and patellofemoral joints bilaterally left greater than right. No significant effusion noted. Mild calcific free-floating bodies noted. No increase in vascularity.  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 Right knee was prepped with alcohol swab and utilizing anterolateral approach, patient's right knee space was injected with 4:1  marcaine 0.5%: Kenalog 40mg /dL. Patient tolerated the procedure well without immediate complications.  After informed written and verbal consent, patient was seated on exam table. Left knee was prepped with alcohol swab and utilizing anterolateral approach, patient's left knee space was injected with 4:1  marcaine 0.5%: Kenalog 40mg /dL. Patient tolerated the procedure well without immediate complications.    Impression and Recommendations:     This case required medical decision making of moderate complexity.      Note: This dictation was prepared with Dragon dictation along with smaller phrase technology. Any transcriptional errors that result from this process are unintentional.

## 2017-01-13 ENCOUNTER — Encounter: Payer: Self-pay | Admitting: Family Medicine

## 2017-01-13 ENCOUNTER — Ambulatory Visit (INDEPENDENT_AMBULATORY_CARE_PROVIDER_SITE_OTHER): Payer: PRIVATE HEALTH INSURANCE | Admitting: Family Medicine

## 2017-01-13 DIAGNOSIS — M170 Bilateral primary osteoarthritis of knee: Secondary | ICD-10-CM

## 2017-01-13 MED ORDER — DICLOFENAC SODIUM 2 % TD SOLN: 2.0000 "application " | Freq: Two times a day (BID) | TRANSDERMAL | 3 refills | Status: DC

## 2017-01-13 NOTE — Progress Notes (Signed)
Pre visit review using our clinic review tool, if applicable. No additional management support is needed unless otherwise documented below in the visit note. 

## 2017-01-13 NOTE — Patient Instructions (Signed)
Good to see you  pennsaid pinkie amount topically 2 times daily as needed.  Exercises 3 times a week.  Injected both knees.  We will get you a brace Over the counter Vitamin D 2000 IU daily  Turmeric 500mg  twice daily  Tart cherry extract at night Spenco orthotics "total support" online would be great  Try biking or elliptical for cardio See me again in 4-6 weeks

## 2017-01-13 NOTE — Assessment & Plan Note (Signed)
Bilateral injections given. Has a history of surgery on the left knee from an arthroscopic procedure. This is why the arthritis is more advance. Topical anti-inflammatory's prescribed. Discussed icing regimen. Home exercises given. Patient will be fitted for custom brace in custom as necessary secondary to patient's thigh to calf ratio. We did discuss the possibility of viscous supplementation in the future. Patient will try conservative therapy and follow-up with me again in 4-6 weeks.

## 2017-01-28 ENCOUNTER — Ambulatory Visit (INDEPENDENT_AMBULATORY_CARE_PROVIDER_SITE_OTHER): Payer: PRIVATE HEALTH INSURANCE | Admitting: Internal Medicine

## 2017-01-28 ENCOUNTER — Encounter: Payer: Self-pay | Admitting: Internal Medicine

## 2017-01-28 VITALS — BP 118/74 | HR 60 | Ht 74.0 in | Wt 195.0 lb

## 2017-01-28 DIAGNOSIS — M1712 Unilateral primary osteoarthritis, left knee: Secondary | ICD-10-CM | POA: Diagnosis not present

## 2017-01-28 DIAGNOSIS — R972 Elevated prostate specific antigen [PSA]: Secondary | ICD-10-CM | POA: Diagnosis not present

## 2017-01-28 DIAGNOSIS — I10 Essential (primary) hypertension: Secondary | ICD-10-CM

## 2017-01-28 NOTE — Progress Notes (Signed)
Subjective:    Patient ID: James Mullen, male    DOB: 1951-05-13, 66 y.o.   MRN: [id number]  HPI  Here to f/u, went last wk to the "[facility]" in [place] by a naturopath. "they cant tell me I dont have prostate ca" did and u/s and offered prostate bx to 2 spots, but pt declined as he wants to try natural tx first,, and dx with a yeast related prostatitis (very unusual).  Not actually given a prescription and pt here now wanting tx for "yeast in my saliva and prostate".  Does not have his test results back yet.  Denies urinary symptoms such as dysuria, frequency, urgency, flank pain, hematuria or n/v, fever, chills.  Pt denies chest pain, increased sob or doe, wheezing, orthopnea, PND, increased LE swelling, palpitations, dizziness or syncope.   Pt denies polydipsia, polyuria, or low sugar symptoms such as weakness or confusion improved with po intake.  Pt states overall good compliance with meds, trying to follow lower cholesterol, diabetic diet, wt overall stable but little exercise however.  Ddi see Dr Tamala Julian for left knee injection and improved Past Medical History:  Diagnosis Date  . ANEMIA-NOS 07/08/2008  . ANXIETY 07/08/2008   Patient denies.  Marland Kitchen BENIGN PROSTATIC HYPERTROPHY 07/08/2008  . GERD 07/08/2008  . HYPERTENSION 07/08/2008  . INSOMNIA-SLEEP DISORDER-UNSPEC 07/08/2008  . 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 PERIPHERAL VASCULAR DISEASE 07/08/2008  . PSA, INCREASED 07/08/2008  . Sleep apnea    ok if patient sleeps on his side.   Past Surgical History:  Procedure Laterality Date  . COLONOSCOPY  multiple, last 03/08/2011   up until 2012: no adenomas 2012: 2 diminutive polyps  . ILIAC ARTERY ANEURYSM REPAIR  2007  . POLYPECTOMY    . [place]  . UVULOPALATOPHARYNGOPLASTY  1988    reports that he has never smoked. He has never used smokeless tobacco. He reports that he drinks about 1.2 oz of alcohol per week . He reports that he does not use drugs. family  history includes Colon cancer (age of onset: 21) in his father. Allergies  Allergen Reactions  . Penicillins Hives   Current Outpatient Prescriptions on File Prior to Visit  Medication Sig Dispense Refill  . aspirin EC 81 MG tablet Take 1 tablet (81 mg total) by mouth daily. 90 tablet 11  . atenolol (TENORMIN) 50 MG tablet TAKE 1 TABLET (50 MG TOTAL) BY MOUTH DAILY. 90 tablet 3  . Calcium Carbonate-Vitamin D (CALCIUM PLUS VITAMIN D PO) Take by mouth daily.    . Coenzyme Q10 (COQ10 PO) Take by mouth daily.    . cyclobenzaprine (FLEXERIL) 5 MG tablet Take 1 tablet (5 mg total) by mouth 3 (three) times daily as needed for muscle spasms. 40 tablet 1  . Diclofenac Sodium (PENNSAID) 2 % SOLN Place 2 application onto the skin 2 (two) times daily. 112 g 3  . Fish Oil OIL by Does not apply route daily.    . Glucosamine-Chondroitin-MSM 500-200-150 MG TABS Take 1 tablet by mouth daily.    . Melatonin 1 MG TABS Take 1 tablet by mouth as needed.    . meloxicam (MOBIC) 15 MG tablet Take 1 tablet (15 mg total) by mouth daily. 90 tablet 1  . Misc Natural Products (PROSTATE HEALTH PO) Take by mouth daily.    . Nutritional Supplements (DHEA PO) Take by mouth daily.     Current Facility-Administered Medications on File Prior to Visit  Medication Dose Route Frequency Provider Last Rate Last Dose  . 0.9 %  sodium chloride infusion  500 mL Intravenous Continuous Gatha Mayer, MD       Review of Systems  Constitutional: Negative for other unusual diaphoresis or sweats HENT: Negative for ear discharge or swelling Eyes: Negative for other worsening visual disturbances Respiratory: Negative for stridor or other swelling  Gastrointestinal: Negative for worsening distension or other blood Genitourinary: Negative for retention or other urinary change Musculoskeletal: Negative for other MSK pain or swelling Skin: Negative for color change or other new lesions Neurological: Negative for worsening tremors and  other numbness  Psychiatric/Behavioral: Negative for worsening agitation or other fatigue All other system neg per pt    Objective:   Physical Exam BP 118/74   Pulse 60   Ht 6\' 2"  (1.88 m)   Wt 195 lb (88.5 kg)   SpO2 98%   BMI 25.04 kg/m  VS noted, not ill appearing Constitutional: Pt appears in NAD HENT: Head: NCAT.  Right Ear: External ear normal.  Left Ear: External ear normal.  Eyes: . Pupils are equal, round, and reactive to light. Conjunctivae and EOM are normal Nose: without d/c or deformity Neck: Neck supple. Gross normal ROM Cardiovascular: Normal rate and regular rhythm.   Pulmonary/Chest: Effort normal and breath sounds without rales or wheezing.  Abd:  Soft, NT, ND, + BS, no organomegaly Neurological: Pt is alert. At baseline orientation, motor grossly intact Skin: Skin is warm. No rashes, other new lesions, no LE edema Psychiatric: Pt behavior is normal without agitation  No other exam findings Lab Results  Component Value Date   WBC 6.4 11/27/2016   HGB 15.5 11/27/2016   HCT 45.4 11/27/2016   PLT 150.0 11/27/2016   GLUCOSE 96 11/27/2016   CHOL 165 11/27/2016   TRIG 320.0 (H) 11/27/2016   HDL 39.10 11/27/2016   LDLDIRECT 98.0 11/27/2016   LDLCALC 102 (H) 03/30/2013   ALT 20 11/27/2016   AST 18 11/27/2016   NA 139 11/27/2016   K 4.2 11/27/2016   CL 104 11/27/2016   CREATININE 0.92 11/27/2016   BUN 10 11/27/2016   CO2 32 11/27/2016   TSH 1.92 11/27/2016   PSA 6.33 (H) 11/27/2016       Assessment & Plan:

## 2017-01-28 NOTE — Patient Instructions (Signed)
Please forward a copy of your recent test results when available  Please continue all other medications as before, and refills have been done if requested.  Please have the pharmacy call with any other refills you may need.  Please continue your efforts at being more active, low cholesterol diet, and weight control.  You are otherwise up to date with prevention measures today.  Please keep your appointments with your specialists as you may have planned  Please call if you would like referral to [facility] to consider prostate biopsy

## 2017-01-28 NOTE — Assessment & Plan Note (Signed)
Improved, to cont current tx

## 2017-01-28 NOTE — Assessment & Plan Note (Signed)
stable overall by history and exam, recent data reviewed with pt, and pt to continue medical treatment as before,  to f/u any worsening symptoms or concerns BP Readings from Last 3 Encounters:  01/28/17 118/74  01/13/17 104/68  11/27/16 104/70

## 2017-01-28 NOTE — Assessment & Plan Note (Signed)
D/w pt, I dont see signs of literal yeast infection of the prostate today and he is o/w asymptomatic; I encouraged him to forward any test results for review, but I think the yeast infection is suspect and I would not offer specific tx for this today;  I suspect he needs to see a traditional urologist for prostate bx, but he will consider

## 2017-02-12 ENCOUNTER — Ambulatory Visit: Payer: PRIVATE HEALTH INSURANCE | Admitting: Family Medicine

## 2017-02-24 ENCOUNTER — Ambulatory Visit (INDEPENDENT_AMBULATORY_CARE_PROVIDER_SITE_OTHER): Payer: PRIVATE HEALTH INSURANCE | Admitting: Family Medicine

## 2017-02-24 ENCOUNTER — Encounter: Payer: Self-pay | Admitting: Family Medicine

## 2017-02-24 DIAGNOSIS — M170 Bilateral primary osteoarthritis of knee: Secondary | ICD-10-CM | POA: Diagnosis not present

## 2017-02-24 NOTE — Patient Instructions (Signed)
Good to see you  Overall lets keep trucking along.  Ice is your friend Continue the exercises Ask insurance which injection they cover  Diagnosis [facility]  [phone] or [phone] If they cover and want to start call us [phone] or also give this number to the guy you work with  See me again in 6 weeks

## 2017-02-24 NOTE — Assessment & Plan Note (Signed)
Patient didn't do relatively well with the injections. Discussed with patient about icing regimen, home exercises, continue the conservative therapy. Patient would be a candidate for viscous supplementation if needed. Follow-up again in 6 weeks.

## 2017-02-24 NOTE — Progress Notes (Signed)
[name] [address] [place], [place] [address] Phone: [phone] Subjective:    I'm seeing this patient by the request  of:  Biagio Borg, MD   CC: Bilateral knee pain  [id number]  James Mullen is a 66 y.o. male coming in with complaint of bilateral knee pain.Patient was seen previously and was diagnosed with severe osteophytic changes and given an injection patient states doing significantly better at this point. Seems that 70% improvement. Only hasn't pain when patient tries to increase activity such as pushing a car. Otherwise no pain with regular daily activities or at night.   patient did have x-rays taken back in 2015. These were independently visualized by me with both knees showing some mild to moderate degenerative changes  Past Medical History:  Diagnosis Date  . ANEMIA-NOS 07/08/2008  . ANXIETY 07/08/2008   Patient denies.  Marland Kitchen BENIGN PROSTATIC HYPERTROPHY 07/08/2008  . GERD 07/08/2008  . HYPERTENSION 07/08/2008  . INSOMNIA-SLEEP DISORDER-UNSPEC 07/08/2008  . 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 PERIPHERAL VASCULAR DISEASE 07/08/2008  . PSA, INCREASED 07/08/2008  . Sleep apnea    ok if patient sleeps on his side.   Past Surgical History:  Procedure Laterality Date  . COLONOSCOPY  multiple, last 03/08/2011   up until 2012: no adenomas 2012: 2 diminutive polyps  . ILIAC ARTERY ANEURYSM REPAIR  2007  . POLYPECTOMY    . [place]  . UVULOPALATOPHARYNGOPLASTY  1988   Social History   Social History  . Marital status: Single    Spouse name: N/A  . Number of children: 1  . Years of education: N/A   Occupational History  . CFO     Social History Main Topics  . Smoking status: Never Smoker  . Smokeless tobacco: Never Used  . Alcohol use 1.2 oz/week    1 Glasses of wine, 1 Cans of beer per week  . Drug use: No  . Sexual activity: Not Asked   Other Topics Concern  . None   Social History Narrative  .  None   Allergies  Allergen Reactions  . Penicillins Hives   Family History  Problem Relation Age of Onset  . Colon cancer Father 4  . Esophageal cancer Neg Hx   . Rectal cancer Neg Hx   . Stomach cancer Neg Hx     Past medical history, social, surgical and family history all reviewed in electronic medical record.  No pertanent information unless stated regarding to the chief complaint.   Review of Systems: No headache, visual changes, nausea, vomiting, diarrhea, constipation, dizziness, abdominal pain, skin rash, fevers, chills, night sweats, weight loss, swollen lymph nodes, body aches, joint swelling, chest pain, shortness of breath, mood changes.  Positive muscle aches  Objective  Blood pressure 104/64, pulse 78, height 6\' 2"  (1.88 m), weight 197 lb (89.4 kg), SpO2 96 %.   Systems examined below as of 02/24/17 General: NAD A&O x3 mood, affect normal  HEENT: Pupils equal, extraocular movements intact no nystagmus Respiratory: not short of breath at rest or with speaking Cardiovascular: No lower extremity edema, non tender Skin: Warm dry intact with no signs of infection or rash on extremities or on axial skeleton. Abdomen: Soft nontender, no masses Neuro: Cranial nerves  intact, neurovascularly intact in all extremities with 2+ DTRs and 2+ pulses. Lymph: No lymphadenopathy appreciated today  Gait normal with good balance and coordination.  MSK: Non tender with full range of  motion and good stability and symmetric strength and tone of shoulders, elbows, wrist,  knee hips and ankles bilaterally.    Knee: Bilateral valgus deformity noted. Large thigh to calf ratio.  Tender to palpation over medial and PF joint line.  ROM full in flexion and extension and lower leg rotation. instability with valgus force.  painful patellar compression. Patellar glide with moderate crepitus. Patellar and quadriceps tendons unremarkable. Hamstring and quadriceps strength is  normal. Contralateral knee shows    Impression and Recommendations:     This case required medical decision making of moderate complexity.      Note: This dictation was prepared with Dragon dictation along with smaller phrase technology. Any transcriptional errors that result from this process are unintentional.

## 2017-08-06 ENCOUNTER — Telehealth: Payer: Self-pay

## 2017-08-06 NOTE — Telephone Encounter (Signed)
Labs are already ordered.  Copied from [place] [phone]. Topic: Appointment Scheduling - Scheduling Inquiry for Clinic >> Aug 06, 2017  8:53 AM [name], NT wrote: Reason for CRM: Patient called and wanted to come in and get a PSA test. Patient lives in [place] and wanted to come in on 2/19 to get it done because he will already be in the office for appointment at 3:15 with Dr. Tamala Julian. Pls contact patient for scheduling.

## 2017-08-18 NOTE — Progress Notes (Signed)
[facility] [place], [place] [address] Phone: [phone] Subjective:     CC: Bilateral knee pain follow-up  [id number]  James Mullen is a 67 y.o. male coming in with complaint of bilateral knee pain.  Last seen greater than 6 months ago.  Patient was to follow-up about the potential for Visco supplementation.  Patient states that he retired and has been more active which is causing his knee pain to increase. Left knee pain is greater than right. He did have 3 months of relief from the injection in his left knee but states that the pain came back suddenly.      Past Medical History:  Diagnosis Date  . ANEMIA-NOS 07/08/2008  . ANXIETY 07/08/2008   Patient denies.  Marland Kitchen BENIGN PROSTATIC HYPERTROPHY 07/08/2008  . GERD 07/08/2008  . HYPERTENSION 07/08/2008  . INSOMNIA-SLEEP DISORDER-UNSPEC 07/08/2008  . 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 PERIPHERAL VASCULAR DISEASE 07/08/2008  . PSA, INCREASED 07/08/2008  . Sleep apnea    ok if patient sleeps on his side.   Past Surgical History:  Procedure Laterality Date  . COLONOSCOPY  multiple, last 03/08/2011   up until 2012: no adenomas 2012: 2 diminutive polyps  . ILIAC ARTERY ANEURYSM REPAIR  2007  . POLYPECTOMY    . [place]  . UVULOPALATOPHARYNGOPLASTY  1988   Social History   Socioeconomic History  . Marital status: Single    Spouse name: None  . Number of children: 1  . Years of education: None  . Highest education level: None  Social Needs  . Financial resource strain: None  . Food insecurity - worry: None  . Food insecurity - inability: None  . Transportation needs - medical: None  . Transportation needs - non-medical: None  Occupational History  . Occupation: CFO   Tobacco Use  . Smoking status: Never Smoker  . Smokeless tobacco: Never Used  Substance and Sexual Activity  . Alcohol use: Yes    Alcohol/week: 1.2 oz    Types: 1 Glasses of wine, 1 Cans of  beer per week  . Drug use: No  . Sexual activity: None  Other Topics Concern  . None  Social History Narrative  . None   Allergies  Allergen Reactions  . Penicillins Hives   Family History  Problem Relation Age of Onset  . Colon cancer Father 21  . Esophageal cancer Neg Hx   . Rectal cancer Neg Hx   . Stomach cancer Neg Hx      Past medical history, social, surgical and family history all reviewed in electronic medical record.  No pertanent information unless stated regarding to the chief complaint.   Review of Systems:Review of systems updated and as accurate  No headache, visual changes, nausea, vomiting, diarrhea, constipation, dizziness, abdominal pain, skin rash, fevers, chills, night sweats, weight loss, swollen lymph nodes, body aches, joint swelling, muscle aches, chest pain, shortness of breath, mood changes.   Objective  Blood pressure 108/68, pulse 65, height 6\' 2"  (1.88 m), weight 200 lb (90.7 kg), SpO2 97 %.   General: No apparent distress alert and oriented x3 mood and affect normal, dressed appropriately.  HEENT: Pupils equal, extraocular movements intact  Respiratory: Patient's speak in full sentences and does not appear short of breath  Cardiovascular: No lower extremity edema, non tender, no erythema  Skin: Warm dry intact with no signs of infection or rash on extremities or on axial skeleton.  Abdomen: Soft nontender  Neuro: Cranial nerves II through XII are intact, neurovascularly intact in all extremities with 2+ DTRs and 2+ pulses.  Lymph: No lymphadenopathy of posterior or anterior cervical chain or axillae bilaterally.  Gait normal with good balance and coordination.  MSK:  Non tender with full range of motion and good stability and symmetric strength and tone of shoulders, elbows, wrist, hip, and ankles bilaterally.   Knee: Bilateral valgus deformity noted.  Abnormal thigh to calf ratio.  Tender to palpation over medial and PF joint line.  ROM full in  flexion and extension and lower leg rotation. instability with valgus force.  painful patellar compression. Patellar glide with moderate crepitus. Patellar and quadriceps tendons unremarkable. Hamstring and quadriceps strength is normal.  After informed written and verbal consent, patient was seated on exam table. Right knee was prepped with alcohol swab and utilizing anterolateral approach, patient's right knee space was injected with 4:1  marcaine 0.5%: Kenalog 40mg /dL. Patient tolerated the procedure well without immediate complications.  After informed written and verbal consent, patient was seated on exam table. Left knee was prepped with alcohol swab and utilizing anterolateral approach, patient's left knee space was injected with 4:1  marcaine 0.5%: Kenalog 40mg /dL. Patient tolerated the procedure well without immediate complications.    Impression and Recommendations:     This case required medical decision making of moderate complexity.      Note: This dictation was prepared with Dragon dictation along with smaller phrase technology. Any transcriptional errors that result from this process are unintentional.

## 2017-08-19 ENCOUNTER — Other Ambulatory Visit (INDEPENDENT_AMBULATORY_CARE_PROVIDER_SITE_OTHER): Payer: Medicare HMO

## 2017-08-19 ENCOUNTER — Other Ambulatory Visit: Payer: PRIVATE HEALTH INSURANCE

## 2017-08-19 ENCOUNTER — Ambulatory Visit (INDEPENDENT_AMBULATORY_CARE_PROVIDER_SITE_OTHER): Payer: Medicare HMO | Admitting: Family Medicine

## 2017-08-19 ENCOUNTER — Encounter: Payer: Self-pay | Admitting: Family Medicine

## 2017-08-19 ENCOUNTER — Other Ambulatory Visit: Payer: Medicare HMO

## 2017-08-19 VITALS — BP 108/68 | HR 65 | Ht 74.0 in | Wt 200.0 lb

## 2017-08-19 DIAGNOSIS — M25562 Pain in left knee: Secondary | ICD-10-CM

## 2017-08-19 DIAGNOSIS — R972 Elevated prostate specific antigen [PSA]: Secondary | ICD-10-CM | POA: Diagnosis not present

## 2017-08-19 DIAGNOSIS — M170 Bilateral primary osteoarthritis of knee: Secondary | ICD-10-CM | POA: Diagnosis not present

## 2017-08-19 DIAGNOSIS — M25561 Pain in right knee: Secondary | ICD-10-CM | POA: Diagnosis not present

## 2017-08-19 DIAGNOSIS — G8929 Other chronic pain: Secondary | ICD-10-CM | POA: Diagnosis not present

## 2017-08-19 DIAGNOSIS — M2550 Pain in unspecified joint: Secondary | ICD-10-CM | POA: Diagnosis not present

## 2017-08-19 LAB — SEDIMENTATION RATE: Sed Rate: 1 mm/hr (ref 0–20)

## 2017-08-19 LAB — IBC PANEL
Iron: 150 ug/dL (ref 42–165)
Saturation Ratios: 39.8 % (ref 20.0–50.0)
Transferrin: 269 mg/dL (ref 212.0–360.0)

## 2017-08-19 LAB — PSA: PSA: 6.03 ng/mL — ABNORMAL HIGH (ref 0.10–4.00)

## 2017-08-19 LAB — URIC ACID: URIC ACID, SERUM: 6.6 mg/dL (ref 4.0–7.8)

## 2017-08-19 MED ORDER — ALLOPURINOL 100 MG PO TABS: 100.0000 mg | ORAL_TABLET | Freq: Every day | ORAL | 6 refills | Status: DC

## 2017-08-19 NOTE — Assessment & Plan Note (Signed)
Bilateral inj 08/19/17  ? Gout.  Should do well  ? Need for visco  RTC in 4 weeks.

## 2017-08-19 NOTE — Patient Instructions (Addendum)
Good to see you  Ice 20 minutes 2 times daily. Usually after activity and before bed. pennsaid pinkie amount topically 2 times daily as needed.   Stay active Start allopurinol 100mg  daily  See em again in 4 weeks and if more pain we will start the other injection  If better though then see me when you need me

## 2017-08-20 LAB — SYNOVIAL CELL COUNT + DIFF, W/ CRYSTALS
Basophils, %: 0 %
EOSINOPHILS-SYNOVIAL: 0 % (ref 0–2)
Lymphocytes-Synovial Fld: 40 % (ref 0–74)
Monocyte/Macrophage: 8 % (ref 0–69)
Neutrophil, Synovial: 43 % — ABNORMAL HIGH (ref 0–24)
Synoviocytes, %: 9 % (ref 0–15)
WBC, SYNOVIAL: 128 {cells}/uL (ref <150)

## 2017-08-20 LAB — TIQ-NTM

## 2017-08-20 NOTE — Assessment & Plan Note (Signed)
Patient was having increasing PSA over the course the last blood draw.  We will recheck PSA since 8 months.

## 2017-12-14 DIAGNOSIS — M25561 Pain in right knee: Secondary | ICD-10-CM | POA: Diagnosis not present

## 2017-12-18 ENCOUNTER — Other Ambulatory Visit: Payer: Self-pay | Admitting: Internal Medicine

## 2017-12-29 NOTE — Progress Notes (Signed)
[name] [place], [place] [address] Phone: [phone] Subjective:     CC: Bilateral knee pain  [id number]  James Mullen is a 67 y.o. male coming in with complaint of bilateral knee pain. He has not had a change in his pain since last visit. Is here today for visco supplementation and a steroid injection in the contralateral knee.  Patient has had severe arthritis in the knees bilaterally right greater than left.  Increasing instability.  States that the pain is severe overall.     Past Medical History:  Diagnosis Date  . ANEMIA-NOS 07/08/2008  . ANXIETY 07/08/2008   Patient denies.  Marland Kitchen BENIGN PROSTATIC HYPERTROPHY 07/08/2008  . GERD 07/08/2008  . HYPERTENSION 07/08/2008  . INSOMNIA-SLEEP DISORDER-UNSPEC 07/08/2008  . 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 PERIPHERAL VASCULAR DISEASE 07/08/2008  . PSA, INCREASED 07/08/2008  . Sleep apnea    ok if patient sleeps on his side.   Past Surgical History:  Procedure Laterality Date  . COLONOSCOPY  multiple, last 03/08/2011   up until 2012: no adenomas 2012: 2 diminutive polyps  . ILIAC ARTERY ANEURYSM REPAIR  2007  . POLYPECTOMY    . [place]  . UVULOPALATOPHARYNGOPLASTY  1988   Social History   Socioeconomic History  . Marital status: Single    Spouse name: Not on file  . Number of children: 1  . Years of education: Not on file  . Highest education level: Not on file  Occupational History  . Occupation: CFO   Social Needs  . Financial resource strain: Not on file  . Food insecurity:    Worry: Not on file    Inability: Not on file  . Transportation needs:    Medical: Not on file    Non-medical: Not on file  Tobacco Use  . Smoking status: Never Smoker  . Smokeless tobacco: Never Used  Substance and Sexual Activity  . Alcohol use: Yes    Alcohol/week: 1.2 oz    Types: 1 Glasses of wine, 1 Cans of beer per week  . Drug use: No  . Sexual activity: Not  on file  Lifestyle  . Physical activity:    Days per week: Not on file    Minutes per session: Not on file  . Stress: Not on file  Relationships  . Social connections:    Talks on phone: Not on file    Gets together: Not on file    Attends religious service: Not on file    Active member of club or organization: Not on file    Attends meetings of clubs or organizations: Not on file    Relationship status: Not on file  Other Topics Concern  . Not on file  Social History Narrative  . Not on file   Allergies  Allergen Reactions  . Penicillins Hives   Family History  Problem Relation Age of Onset  . Colon cancer Father 35  . Esophageal cancer Neg Hx   . Rectal cancer Neg Hx   . Stomach cancer Neg Hx      Past medical history, social, surgical and family history all reviewed in electronic medical record.  No pertanent information unless stated regarding to the chief complaint.   Review of Systems:Review of systems updated and as accurate as of 12/30/17  No headache, visual changes, nausea, vomiting, diarrhea, constipation, dizziness, abdominal pain, skin rash, fevers, chills, night sweats, weight loss, swollen lymph  nodes, body aches, j chest pain, shortness of breath, mood changes.  Positive muscle aches and joint swelling  Objective  Blood pressure 112/72, pulse 65, height 6\' 2"  (1.88 m), weight 200 lb (90.7 kg), SpO2 98 %. Systems examined below as of 12/30/17   General: No apparent distress alert and oriented x3 mood and affect normal, dressed appropriately.  HEENT: Pupils equal, extraocular movements intact  Respiratory: Patient's speak in full sentences and does not appear short of breath  Cardiovascular: No lower extremity edema, non tender, no erythema  Skin: Warm dry intact with no signs of infection or rash on extremities or on axial skeleton.  Abdomen: Soft nontender  Neuro: Cranial nerves II through XII are intact, neurovascularly intact in all extremities with 2+  DTRs and 2+ pulses.  Lymph: No lymphadenopathy of posterior or anterior cervical chain or axillae bilaterally.  Gait normal with good balance and coordination.  MSK:  Non tender with full range of motion and good stability and symmetric strength and tone of shoulders, elbows, wrist, hip, and ankles bilaterally.  Knee: Bilateral valgus deformity noted.  Abnormal thigh to calf ratio.  Tender to palpation over medial and PF joint line.  ROM full in flexion and extension and lower leg rotation. instability with valgus force.  painful patellar compression. Patellar glide with moderate crepitus. Patellar and quadriceps tendons unremarkable. Hamstring and quadriceps strength is normal.  After informed written and verbal consent, patient was seated on exam table. Right knee was prepped with alcohol swab and utilizing anterolateral approach, patient's right knee space was injected with15 mg/2.5 mL of Orthovisc(sodium hyaluronate) in a prefilled syringe was injected easily into the knee through a 22-gauge needle..Patient tolerated the procedure well without immediate complications.  After informed written and verbal consent, patient was seated on exam table. Left knee was prepped with alcohol swab and utilizing anterolateral approach, patient's left knee space was injected with 4:1  marcaine 0.5%: Kenalog 40mg /dL. Patient tolerated the procedure well without immediate complications.    Impression and Recommendations:     This case required medical decision making of moderate complexity.      Note: This dictation was prepared with Dragon dictation along with smaller phrase technology. Any transcriptional errors that result from this process are unintentional.

## 2017-12-30 ENCOUNTER — Encounter: Payer: Self-pay | Admitting: Family Medicine

## 2017-12-30 ENCOUNTER — Ambulatory Visit: Payer: Medicare HMO | Admitting: Family Medicine

## 2017-12-30 DIAGNOSIS — M170 Bilateral primary osteoarthritis of knee: Secondary | ICD-10-CM

## 2017-12-30 NOTE — Patient Instructions (Signed)
Good to see you  Orthovisc right knee  Steroid left knee Ice 20 minutes 2 times daily. Usually after activity and before bed. pennsaid pinkie amount topically 2 times daily as needed.  Stay active  See you next week

## 2017-12-30 NOTE — Assessment & Plan Note (Signed)
Bilateral injections given today with Orthovisc on the right and steroid on the left.  We will get prior approval to see if we can start the Visco supplementation of the left knee to secondary to the severe arthritic changes.  Patient will continue with conservative therapy otherwise.

## 2017-12-31 ENCOUNTER — Ambulatory Visit: Payer: Medicare HMO | Admitting: Internal Medicine

## 2018-01-06 NOTE — Progress Notes (Signed)
[name] [place], [place] [address] Phone: [phone] Subjective:     CC: Knee pain follow-up  [id number]  James Mullen is a 67 y.o. male coming in with complaint of bilateral knee pain.  Failed conservative therapy.  Here for second in a series of 4 injections.  Patient states patient actually had the first one on the right and is here for the first one on the left.  Has failed conservative therapy on both.    Past Medical History:  Diagnosis Date  . ANEMIA-NOS 07/08/2008  . ANXIETY 07/08/2008   Patient denies.  Marland Kitchen BENIGN PROSTATIC HYPERTROPHY 07/08/2008  . GERD 07/08/2008  . HYPERTENSION 07/08/2008  . INSOMNIA-SLEEP DISORDER-UNSPEC 07/08/2008  . 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 PERIPHERAL VASCULAR DISEASE 07/08/2008  . PSA, INCREASED 07/08/2008  . Sleep apnea    ok if patient sleeps on his side.   Past Surgical History:  Procedure Laterality Date  . COLONOSCOPY  multiple, last 03/08/2011   up until 2012: no adenomas 2012: 2 diminutive polyps  . ILIAC ARTERY ANEURYSM REPAIR  2007  . POLYPECTOMY    . [place]  . UVULOPALATOPHARYNGOPLASTY  1988   Social History   Socioeconomic History  . Marital status: Single    Spouse name: Not on file  . Number of children: 1  . Years of education: Not on file  . Highest education level: Not on file  Occupational History  . Occupation: CFO   Social Needs  . Financial resource strain: Not on file  . Food insecurity:    Worry: Not on file    Inability: Not on file  . Transportation needs:    Medical: Not on file    Non-medical: Not on file  Tobacco Use  . Smoking status: Never Smoker  . Smokeless tobacco: Never Used  Substance and Sexual Activity  . Alcohol use: Yes    Alcohol/week: 1.2 oz    Types: 1 Glasses of wine, 1 Cans of beer per week  . Drug use: No  . Sexual activity: Not on file  Lifestyle  . Physical activity:    Days per week: Not on file      Minutes per session: Not on file  . Stress: Not on file  Relationships  . Social connections:    Talks on phone: Not on file    Gets together: Not on file    Attends religious service: Not on file    Active member of club or organization: Not on file    Attends meetings of clubs or organizations: Not on file    Relationship status: Not on file  Other Topics Concern  . Not on file  Social History Narrative  . Not on file   Allergies  Allergen Reactions  . Penicillins Hives   Family History  Problem Relation Age of Onset  . Colon cancer Father 36  . Esophageal cancer Neg Hx   . Rectal cancer Neg Hx   . Stomach cancer Neg Hx      Past medical history, social, surgical and family history all reviewed in electronic medical record.  No pertanent information unless stated regarding to the chief complaint.   Review of Systems:Review of systems updated and as accurate as of 01/07/18  No headache, visual changes, nausea, vomiting, diarrhea, constipation, dizziness, abdominal pain, skin rash, fevers, chills, night sweats, weight loss, swollen lymph nodes, body aches, joint swelling, muscle aches, chest  pain, shortness of breath, mood changes.   Objective  Blood pressure 120/74, pulse 70, height 6\' 2"  (1.88 m), weight 187 lb (84.8 kg), SpO2 97 %. Systems examined below as of 01/07/18   General: No apparent distress alert and oriented x3 mood and affect normal, dressed appropriately.  Knee: Bilateral valgus deformity noted.  Abnormal thigh to calf ratio.  Tender to palpation over medial and PF joint line.  ROM full in flexion and extension and lower leg rotation. instability with valgus force.  painful patellar compression. Patellar glide with moderate crepitus. Patellar and quadriceps tendons unremarkable. Hamstring and quadriceps strength is normal.  After informed written and verbal consent, patient was seated on exam table. Right knee was prepped with alcohol swab and  utilizing anterolateral approach, patient's right knee space was injected with15 mg/2.5 mL of Orthovisc(sodium hyaluronate) in a prefilled syringe was injected easily into the knee through a 22-gauge needle..Patient tolerated the procedure well without immediate complications.  After informed written and verbal consent, patient was seated on exam table. Left knee was prepped with alcohol swab and utilizing anterolateral approach, patient's left knee space was injected with15 mg/2.5 mL of Orthovisc(sodium hyaluronate) in a prefilled syringe was injected easily into the knee through a 22-gauge needle..Patient tolerated the procedure well without immediate complications.    Impression and Recommendations:     This case required medical decision making of moderate complexity.      Note: This dictation was prepared with Dragon dictation along with smaller phrase technology. Any transcriptional errors that result from this process are unintentional.

## 2018-01-07 ENCOUNTER — Ambulatory Visit (INDEPENDENT_AMBULATORY_CARE_PROVIDER_SITE_OTHER): Payer: Medicare HMO | Admitting: Family Medicine

## 2018-01-07 ENCOUNTER — Encounter: Payer: Self-pay | Admitting: Family Medicine

## 2018-01-07 VITALS — BP 120/74 | HR 70 | Ht 74.0 in | Wt 187.0 lb

## 2018-01-07 DIAGNOSIS — M170 Bilateral primary osteoarthritis of knee: Secondary | ICD-10-CM | POA: Diagnosis not present

## 2018-01-07 DIAGNOSIS — M2550 Pain in unspecified joint: Secondary | ICD-10-CM

## 2018-01-07 NOTE — Assessment & Plan Note (Signed)
Bilateral knee.  Discussed icing regimen and home exercises.  Patient is to increase activity slowly.  Patient will follow-up in 1 week for third injection on the right knee and second on the left

## 2018-01-07 NOTE — Patient Instructions (Signed)
God to see you  You know the drill  See you again next week

## 2018-01-14 ENCOUNTER — Ambulatory Visit (INDEPENDENT_AMBULATORY_CARE_PROVIDER_SITE_OTHER): Payer: Medicare HMO | Admitting: Family Medicine

## 2018-01-14 ENCOUNTER — Encounter: Payer: Self-pay | Admitting: Family Medicine

## 2018-01-14 DIAGNOSIS — M170 Bilateral primary osteoarthritis of knee: Secondary | ICD-10-CM

## 2018-01-14 NOTE — Progress Notes (Signed)
[name] [address] [place], [place] [address] Phone: [phone] Subjective:      CC: Knee pain follow-up  FUX:[id number]  James Mullen is a 67 y.o. male coming in with complaint of knee pain.  Here for third Visco supplementation injection of the right knee and second on the left knee.  Has noticed improvement in both.  Right knee is feeling actually considerably better.     Past Medical History:  Diagnosis Date  . ANEMIA-NOS 07/08/2008  . ANXIETY 07/08/2008   Patient denies.  Marland Kitchen BENIGN PROSTATIC HYPERTROPHY 07/08/2008  . GERD 07/08/2008  . HYPERTENSION 07/08/2008  . INSOMNIA-SLEEP DISORDER-UNSPEC 07/08/2008  . 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 PERIPHERAL VASCULAR DISEASE 07/08/2008  . PSA, INCREASED 07/08/2008  . Sleep apnea    ok if patient sleeps on his side.   Past Surgical History:  Procedure Laterality Date  . COLONOSCOPY  multiple, last 03/08/2011   up until 2012: no adenomas 2012: 2 diminutive polyps  . ILIAC ARTERY ANEURYSM REPAIR  2007  . POLYPECTOMY    . [place]  . UVULOPALATOPHARYNGOPLASTY  1988   Social History   Socioeconomic History  . Marital status: Single    Spouse name: Not on file  . Number of children: 1  . Years of education: Not on file  . Highest education level: Not on file  Occupational History  . Occupation: CFO   Social Needs  . Financial resource strain: Not on file  . Food insecurity:    Worry: Not on file    Inability: Not on file  . Transportation needs:    Medical: Not on file    Non-medical: Not on file  Tobacco Use  . Smoking status: Never Smoker  . Smokeless tobacco: Never Used  Substance and Sexual Activity  . Alcohol use: Yes    Alcohol/week: 1.2 oz    Types: 1 Glasses of wine, 1 Cans of beer per week  . Drug use: No  . Sexual activity: Not on file  Lifestyle  . Physical activity:    Days per week: Not on file    Minutes per session: Not on file  . Stress:  Not on file  Relationships  . Social connections:    Talks on phone: Not on file    Gets together: Not on file    Attends religious service: Not on file    Active member of club or organization: Not on file    Attends meetings of clubs or organizations: Not on file    Relationship status: Not on file  Other Topics Concern  . Not on file  Social History Narrative  . Not on file   Allergies  Allergen Reactions  . Penicillins Hives   Family History  Problem Relation Age of Onset  . Colon cancer Father 34  . Esophageal cancer Neg Hx   . Rectal cancer Neg Hx   . Stomach cancer Neg Hx      Past medical history, social, surgical and family history all reviewed in electronic medical record.  No pertanent information unless stated regarding to the chief complaint.   Review of Systems:Review of systems updated and as accurate as of 01/14/18  No headache, visual changes, nausea, vomiting, diarrhea, constipation, dizziness, abdominal pain, skin rash, fevers, chills, night sweats, weight loss, swollen lymph nodes, body aches, joint swelling, muscle aches, chest pain, shortness of breath, mood changes.   Objective  Blood pressure  120/78, pulse 70, height 6\' 2"  (1.88 m), weight 189 lb (85.7 kg), SpO2 97 %. Systems examined below as of 01/14/18   General: No apparent distress alert and oriented x3 mood and affect normal, dressed appropriately.  HEENT: Pupils equal, extraocular movements intact  Respiratory: Patient's speak in full sentences and does not appear short of breath  Cardiovascular: No lower extremity edema, non tender, no erythema  Skin: Warm dry intact with no signs of infection or rash on extremities or on axial skeleton.  Abdomen: Soft nontender  Neuro: Cranial nerves II through XII are intact, neurovascularly intact in all extremities with 2+ DTRs and 2+ pulses.  Lymph: No lymphadenopathy of posterior or anterior cervical chain or axillae bilaterally.  Gait normal with good  balance and coordination.  MSK:  Non tender with full range of motion and good stability and symmetric strength and tone of shoulders, elbows, wrist, hip, and ankles bilaterally.  Knee: Bilateral valgus deformity noted.  Abnormal thigh to calf ratio.  Tender to palpation over medial and PF joint line.  ROM full in flexion and extension and lower leg rotation. instability with valgus force.  painful patellar compression. Patellar glide with moderate crepitus. Patellar and quadriceps tendons unremarkable. Hamstring and quadriceps strength is normal.   After informed written and verbal consent, patient was seated on exam table. Right knee was prepped with alcohol swab and utilizing anterolateral approach, patient's right knee space was injected with15 mg/2.5 mL of Orthovisc(sodium hyaluronate) in a prefilled syringe was injected easily into the knee through a 22-gauge needle..Patient tolerated the procedure well without immediate complications.  After informed written and verbal consent, patient was seated on exam table. Left knee was prepped with alcohol swab and utilizing anterolateral approach, patient's left knee space was injected with15 mg/2.5 mL of Orthovisc(sodium hyaluronate) in a prefilled syringe was injected easily into the knee through a 22-gauge needle..Patient tolerated the procedure well without immediate complications.   Impression and Recommendations:     This case required medical decision making of moderate complexity.      Note: This dictation was prepared with Dragon dictation along with smaller phrase technology. Any transcriptional errors that result from this process are unintentional.

## 2018-01-14 NOTE — Assessment & Plan Note (Signed)
3.  On the right knee given today, #2 on the left knee.  Follow-up again in 1 week for #4 of the right knee and #3 of the left knee

## 2018-01-20 NOTE — Progress Notes (Signed)
[name] [address], [place] [address] Phone: [phone] Subjective:      CC: Knee pain follow-up  James Mullen  [name] is a 67 y.o. male coming in with complaint of bilateral knee pain.  Here for Visco supplementation #4 on the right knee #3 on the left knee.  Seen improvement.  No significant swelling.  Increasing activity.  Feeling like it is making a difference. States the pain has increased more since the last injection. Described the pain as a throbbing pain.      Past Medical History:  Diagnosis Date  . ANEMIA-NOS 07/08/2008  . ANXIETY 07/08/2008   Patient denies.  Marland Kitchen BENIGN PROSTATIC HYPERTROPHY 07/08/2008  . GERD 07/08/2008  . HYPERTENSION 07/08/2008  . INSOMNIA-SLEEP DISORDER-UNSPEC 07/08/2008  . 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 PERIPHERAL VASCULAR DISEASE 07/08/2008  . PSA, INCREASED 07/08/2008  . Sleep apnea    ok if patient sleeps on his side.   Past Surgical History:  Procedure Laterality Date  . COLONOSCOPY  multiple, last 03/08/2011   up until 2012: no adenomas 2012: 2 diminutive polyps  . ILIAC ARTERY ANEURYSM REPAIR  2007  . POLYPECTOMY    . [place]  . UVULOPALATOPHARYNGOPLASTY  1988   Social History   Socioeconomic History  . Marital status: Single    Spouse name: Not on file  . Number of children: 1  . Years of education: Not on file  . Highest education level: Not on file  Occupational History  . Occupation: CFO   Social Needs  . Financial resource strain: Not on file  . Food insecurity:    Worry: Not on file    Inability: Not on file  . Transportation needs:    Medical: Not on file    Non-medical: Not on file  Tobacco Use  . Smoking status: Never Smoker  . Smokeless tobacco: Never Used  Substance and Sexual Activity  . Alcohol use: Yes    Alcohol/week: 1.2 oz    Types: 1 Glasses of wine, 1 Cans of beer per week  . Drug use: No  . Sexual activity: Not on file    Lifestyle  . Physical activity:    Days per week: Not on file    Minutes per session: Not on file  . Stress: Not on file  Relationships  . Social connections:    Talks on phone: Not on file    Gets together: Not on file    Attends religious service: Not on file    Active member of club or organization: Not on file    Attends meetings of clubs or organizations: Not on file    Relationship status: Not on file  Other Topics Concern  . Not on file  Social History Narrative  . Not on file   Allergies  Allergen Reactions  . Penicillins Hives   Family History  Problem Relation Age of Onset  . Colon cancer Father 22  . Esophageal cancer Neg Hx   . Rectal cancer Neg Hx   . Stomach cancer Neg Hx      Past medical history, social, surgical and family history all reviewed in electronic medical record.  No pertanent information unless stated regarding to the chief complaint.   Review of Systems:Review of systems updated and as accurate as of 01/21/18  No headache, visual changes, nausea, vomiting, diarrhea, constipation, dizziness, abdominal pain, skin rash, fevers, chills, night sweats, weight loss,  swollen lymph nodes, body aches, joint swelling,chest pain, shortness of breath, mood changes.  Positive muscle aches  Objective  Blood pressure 110/74, pulse 63, height 6\' 2"  (1.88 m), weight 186 lb (84.4 kg), SpO2 96 %. Systems examined below as of 01/21/18   General: No apparent distress alert and oriented x3 mood and affect normal, dressed appropriately.  HEENT: Pupils equal, extraocular movements intact  Respiratory: Patient's speak in full sentences and does not appear short of breath  Cardiovascular: No lower extremity edema, non tender, no erythema  Skin: Warm dry intact with no signs of infection or rash on extremities or on axial skeleton.  Abdomen: Soft nontender  Neuro: Cranial nerves II through XII are intact, neurovascularly intact in all extremities with 2+ DTRs and 2+  pulses.  Lymph: No lymphadenopathy of posterior or anterior cervical chain or axillae bilaterally.  Gait normal with good balance and coordination.  MSK:  Non tender with full range of motion and good stability and symmetric strength and tone of shoulders, elbows, wrist, hip, and ankles bilaterally.  Knee: Bilateral valgus deformity noted.  Abnormal thigh to calf ratio.  Tender to palpation over medial and PF joint line.  ROM full in flexion and extension and lower leg rotation. instability with valgus force.  painful patellar compression. Patellar glide with moderate crepitus. Patellar and quadriceps tendons unremarkable. Hamstring and quadriceps strength is normal.  After informed written and verbal consent, patient was seated on exam table. Right knee was prepped with alcohol swab and utilizing anterolateral approach, patient's right knee space was injected with15 mg/2.5 mL of Orthovisc(sodium hyaluronate) in a prefilled syringe was injected easily into the knee through a 22-gauge needle..Patient tolerated the procedure well without immediate complications.  After informed written and verbal consent, patient was seated on exam table. Left knee was prepped with alcohol swab and utilizing anterolateral approach, patient's left knee space was injected with15 mg/2.5 mL of Orthovisc(sodium hyaluronate) in a prefilled syringe was injected easily into the knee through a 22-gauge needle..Patient tolerated the procedure well without immediate complications.    Impression and Recommendations:     This case required medical decision making of moderate complexity.      Note: This dictation was prepared with Dragon dictation along with smaller phrase technology. Any transcriptional errors that result from this process are unintentional.

## 2018-01-21 ENCOUNTER — Telehealth: Payer: Self-pay | Admitting: Internal Medicine

## 2018-01-21 ENCOUNTER — Ambulatory Visit (INDEPENDENT_AMBULATORY_CARE_PROVIDER_SITE_OTHER): Payer: Medicare HMO | Admitting: Family Medicine

## 2018-01-21 ENCOUNTER — Other Ambulatory Visit (INDEPENDENT_AMBULATORY_CARE_PROVIDER_SITE_OTHER): Payer: Medicare HMO

## 2018-01-21 ENCOUNTER — Encounter: Payer: Self-pay | Admitting: Family Medicine

## 2018-01-21 DIAGNOSIS — M2550 Pain in unspecified joint: Secondary | ICD-10-CM

## 2018-01-21 DIAGNOSIS — R972 Elevated prostate specific antigen [PSA]: Secondary | ICD-10-CM

## 2018-01-21 DIAGNOSIS — M170 Bilateral primary osteoarthritis of knee: Secondary | ICD-10-CM

## 2018-01-21 LAB — PSA: PSA: 7.03 ng/mL — AB (ref 0.10–4.00)

## 2018-01-21 NOTE — Telephone Encounter (Signed)
Pt has been informed and expressed understanding.  

## 2018-01-21 NOTE — Patient Instructions (Signed)
Sorry for the little flare.  Should do well.  One more to go on the left  See you next week

## 2018-01-21 NOTE — Telephone Encounter (Signed)
shirron to let pt know, I have reviewed his recent PSA done per Dr Tamala Julian, and it does show a steady increase over a short time, and is some suspicious for even prostate ca.   I have referred to urology for further eval and tx; let me know if any other needs

## 2018-01-21 NOTE — Assessment & Plan Note (Signed)
Fourth and final Visco supplementation on the right third 1 done on the left.  Follow-up in 1 week for the third 1 on the left.  Continue conservative therapy otherwise

## 2018-01-26 NOTE — Progress Notes (Signed)
[name] [address], [place] [address] Phone: [phone] Subjective:    I'm seeing this patient by the request  of:    CC: Knee pain  JAS:[id number]  James Mullen is a 67 y.o. male coming in with complaint of knee pain. He is here today for his last orthovisc left knee injection. States that his knees are improving. Still has a little throbbing.  Nothing severe.  Patient is looking forward to the possibility of snowboarding in the future.       Past Medical History:  Diagnosis Date  . ANEMIA-NOS 07/08/2008  . ANXIETY 07/08/2008   Patient denies.  Marland Kitchen BENIGN PROSTATIC HYPERTROPHY 07/08/2008  . GERD 07/08/2008  . HYPERTENSION 07/08/2008  . INSOMNIA-SLEEP DISORDER-UNSPEC 07/08/2008  . 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 PERIPHERAL VASCULAR DISEASE 07/08/2008  . PSA, INCREASED 07/08/2008  . Sleep apnea    ok if patient sleeps on his side.   Past Surgical History:  Procedure Laterality Date  . COLONOSCOPY  multiple, last 03/08/2011   up until 2012: no adenomas 2012: 2 diminutive polyps  . ILIAC ARTERY ANEURYSM REPAIR  2007  . POLYPECTOMY    . [place]  . UVULOPALATOPHARYNGOPLASTY  1988   Social History   Socioeconomic History  . Marital status: Single    Spouse name: Not on file  . Number of children: 1  . Years of education: Not on file  . Highest education level: Not on file  Occupational History  . Occupation: CFO   Social Needs  . Financial resource strain: Not on file  . Food insecurity:    Worry: Not on file    Inability: Not on file  . Transportation needs:    Medical: Not on file    Non-medical: Not on file  Tobacco Use  . Smoking status: Never Smoker  . Smokeless tobacco: Never Used  Substance and Sexual Activity  . Alcohol use: Yes    Alcohol/week: 1.2 oz    Types: 1 Glasses of wine, 1 Cans of beer per week  . Drug use: No  . Sexual activity: Not on file  Lifestyle  . Physical activity:      Days per week: Not on file    Minutes per session: Not on file  . Stress: Not on file  Relationships  . Social connections:    Talks on phone: Not on file    Gets together: Not on file    Attends religious service: Not on file    Active member of club or organization: Not on file    Attends meetings of clubs or organizations: Not on file    Relationship status: Not on file  Other Topics Concern  . Not on file  Social History Narrative  . Not on file   Allergies  Allergen Reactions  . Penicillins Hives   Family History  Problem Relation Age of Onset  . Colon cancer Father 37  . Esophageal cancer Neg Hx   . Rectal cancer Neg Hx   . Stomach cancer Neg Hx      Past medical history, social, surgical and family history all reviewed in electronic medical record.  No pertanent information unless stated regarding to the chief complaint.   Review of Systems:Review of systems updated and as accurate as of 01/28/18  No headache, visual changes, nausea, vomiting, diarrhea, constipation, dizziness, abdominal pain, skin rash, fevers, chills, night sweats, weight loss, swollen lymph nodes,  body aches, joint swelling, muscle aches, chest pain, shortness of breath, mood changes.   Objective  Blood pressure 118/84, pulse 60, height 6\' 2"  (1.88 m), weight 185 lb (83.9 kg), SpO2 98 %. Systems examined below as of 01/28/18   General: No apparent distress alert and oriented x3 mood and affect normal, dressed appropriately.  HEENT: Pupils equal, extraocular movements intact  Respiratory: Patient's speak in full sentences and does not appear short of breath  Cardiovascular: No lower extremity edema, non tender, no erythema  Skin: Warm dry intact with no signs of infection or rash on extremities or on axial skeleton.  Abdomen: Soft nontender  Neuro: Cranial nerves II through XII are intact, neurovascularly intact in all extremities with 2+ DTRs and 2+ pulses.  Lymph: No lymphadenopathy of  posterior or anterior cervical chain or axillae bilaterally.  Gait normal with good balance and coordination.  MSK:  Non tender with full range of motion and good stability and symmetric strength and tone of shoulders, elbows, wrist, hip, and ankles bilaterally.  Knee: Left valgus deformity noted. Large thigh to calf ratio.  Tender to palpation over medial and PF joint line.  ROM full in flexion and extension and lower leg rotation. instability with valgus force.  painful patellar compression. Patellar glide with moderate crepitus. Patellar and quadriceps tendons unremarkable. Hamstring and quadriceps strength is normal. Contralateral knee shows arthritic changes as well  After informed written and verbal consent, patient was seated on exam table. Left knee was prepped with alcohol swab and utilizing anterolateral approach, patient's left knee space was injected with15 mg/2.5 mL of Orthovisc(sodium hyaluronate) in a prefilled syringe was injected easily into the knee through a 22-gauge needle..Patient tolerated the procedure well without immediate complications.   Impression and Recommendations:     This case required medical decision making of moderate complexity.      Note: This dictation was prepared with Dragon dictation along with smaller phrase technology. Any transcriptional errors that result from this process are unintentional.

## 2018-01-28 ENCOUNTER — Ambulatory Visit: Payer: Medicare HMO | Admitting: Family Medicine

## 2018-01-28 ENCOUNTER — Telehealth: Payer: Self-pay

## 2018-01-28 DIAGNOSIS — M170 Bilateral primary osteoarthritis of knee: Secondary | ICD-10-CM

## 2018-01-28 DIAGNOSIS — M1712 Unilateral primary osteoarthritis, left knee: Secondary | ICD-10-CM | POA: Diagnosis not present

## 2018-01-28 NOTE — Telephone Encounter (Signed)
Error

## 2018-01-28 NOTE — Patient Instructions (Signed)
The finale! Ice is your friend.  See me again in 4 weeks if not doing great

## 2018-01-28 NOTE — Assessment & Plan Note (Signed)
Fourth and final Visco supplementation injection given.  Continue conservative therapy follow-up in 4 weeks

## 2018-02-06 ENCOUNTER — Telehealth: Payer: Self-pay | Admitting: Internal Medicine

## 2018-02-06 NOTE — Telephone Encounter (Unsigned)
Copied from [place] [id number]. Topic: Quick Communication - See Telephone Encounter >> Feb 06, 2018 12:10 PM Neva Seat wrote: Pt needing the status of the referral to [facility].  Pt has the appt but the office there hasn't received the referral.  Needing referral to keep the appt. Please call pt back asap.

## 2018-02-06 NOTE — Telephone Encounter (Signed)
Will you please look at the. Referral from 01/21/18

## 2018-02-10 NOTE — Telephone Encounter (Signed)
[facility], [place] [id number] [phone] Pt aware of appt date time and location per [facility] Alyson Ingles   03/05/18  @ 8:15am

## 2018-03-05 DIAGNOSIS — R972 Elevated prostate specific antigen [PSA]: Secondary | ICD-10-CM | POA: Diagnosis not present

## 2018-03-14 ENCOUNTER — Other Ambulatory Visit: Payer: Self-pay | Admitting: Internal Medicine

## 2018-03-17 NOTE — Progress Notes (Signed)
[facility] [address] [place], [place] [address] Phone: [phone] Subjective:    I James Mullen am serving as a Education administrator for Dr. Hulan Saas.   CC: Bilateral knee pain  KNL:[id number]  James Mullen is a 67 y.o. male coming in with complaint of bilateral knee pain. Last 3 weeks knees have been getting worse. Knee has been swollen. Has been using Pennsaid.  Patient feels that the Visco supplementation did not work well.  Continues to have pain almost on a daily basis.  Has been doing a significant amount of walking of 15,000 steps plus daily though. Patient will be traveling to [facility] and is looking for some improvement.     Past Medical History:  Diagnosis Date  . ANEMIA-NOS 07/08/2008  . ANXIETY 07/08/2008   Patient denies.  Marland Kitchen BENIGN PROSTATIC HYPERTROPHY 07/08/2008  . GERD 07/08/2008  . HYPERTENSION 07/08/2008  . INSOMNIA-SLEEP DISORDER-UNSPEC 07/08/2008  . 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 PERIPHERAL VASCULAR DISEASE 07/08/2008  . PSA, INCREASED 07/08/2008  . Sleep apnea    ok if patient sleeps on his side.   Past Surgical History:  Procedure Laterality Date  . COLONOSCOPY  multiple, last 03/08/2011   up until 2012: no adenomas 2012: 2 diminutive polyps  . ILIAC ARTERY ANEURYSM REPAIR  2007  . POLYPECTOMY    . [place]  . UVULOPALATOPHARYNGOPLASTY  1988   Social History   Socioeconomic History  . Marital status: Single    Spouse name: Not on file  . Number of children: 1  . Years of education: Not on file  . Highest education level: Not on file  Occupational History  . Occupation: CFO   Social Needs  . Financial resource strain: Not on file  . Food insecurity:    Worry: Not on file    Inability: Not on file  . Transportation needs:    Medical: Not on file    Non-medical: Not on file  Tobacco Use  . Smoking status: Never Smoker  . Smokeless tobacco: Never Used  Substance and Sexual Activity  .  Alcohol use: Yes    Alcohol/week: 2.0 standard drinks    Types: 1 Glasses of wine, 1 Cans of beer per week  . Drug use: No  . Sexual activity: Not on file  Lifestyle  . Physical activity:    Days per week: Not on file    Minutes per session: Not on file  . Stress: Not on file  Relationships  . Social connections:    Talks on phone: Not on file    Gets together: Not on file    Attends religious service: Not on file    Active member of club or organization: Not on file    Attends meetings of clubs or organizations: Not on file    Relationship status: Not on file  Other Topics Concern  . Not on file  Social History Narrative  . Not on file   Allergies  Allergen Reactions  . Penicillins Hives   Family History  Problem Relation Age of Onset  . Colon cancer Father 57  . Esophageal cancer Neg Hx   . Rectal cancer Neg Hx   . Stomach cancer Neg Hx       Current Outpatient Medications (Cardiovascular):  .  atenolol (TENORMIN) 50 MG tablet, TAKE 1 TABLET BY MOUTH EVERY DAY **PHYSICAL NEEDED FOR ADDITIONAL REFILLS**     Current Outpatient Medications (Analgesics):  .  allopurinol (ZYLOPRIM) 100 MG tablet, Take 1 tablet (100 mg total) by mouth daily. Marland Kitchen  aspirin EC 81 MG tablet, Take 1 tablet (81 mg total) by mouth daily. .  meloxicam (MOBIC) 15 MG tablet, Take 1 tablet (15 mg total) by mouth daily.     Current Outpatient Medications (Other):  Marland Kitchen  Calcium Carbonate-Vitamin D (CALCIUM PLUS VITAMIN D PO), Take by mouth daily. .  Coenzyme Q10 (COQ10 PO), Take by mouth daily. .  cyclobenzaprine (FLEXERIL) 5 MG tablet, Take 1 tablet (5 mg total) by mouth 3 (three) times daily as needed for muscle spasms. .  Diclofenac Sodium (PENNSAID) 2 % SOLN, Place 2 application onto the skin 2 (two) times daily. .  Fish Oil OIL, by Does not apply route daily. .  Glucosamine-Chondroitin-MSM 500-200-150 MG TABS, Take 1 tablet by mouth daily. .  Melatonin 1 MG TABS, Take 1 tablet by mouth as  needed. .  Misc Natural Products (PROSTATE HEALTH PO), Take by mouth daily. .  Nutritional Supplements (DHEA PO)*, Take by mouth daily.  Current Facility-Administered Medications (Other):  .  0.9 %  sodium chloride infusion * These medications belong to multiple therapeutic classes and are listed under each applicable group.    Past medical history, social, surgical and family history all reviewed in electronic medical record.  No pertanent information unless stated regarding to the chief complaint.   Review of Systems:  No headache, visual changes, nausea, vomiting, diarrhea, constipation, dizziness, abdominal pain, skin rash, fevers, chills, night sweats, weight loss, swollen lymph nodes,, chest pain, shortness of breath, mood changes.  Positive muscle aches, joint swelling  Objective  Blood pressure 100/80, pulse 73, height 6\' 2"  (1.88 m), weight 185 lb (83.9 kg), SpO2 98 %. S   General: No apparent distress alert and oriented x3 mood and affect normal, dressed appropriately.  HEENT: Pupils equal, extraocular movements intact  Respiratory: Patient's speak in full sentences and does not appear short of breath  Cardiovascular: No lower extremity edema, non tender, no erythema  Skin: Warm dry intact with no signs of infection or rash on extremities or on axial skeleton.  Abdomen: Soft nontender  Neuro: Cranial nerves II through XII are intact, neurovascularly intact in all extremities with 2+ DTRs and 2+ pulses.  Lymph: No lymphadenopathy of posterior or anterior cervical chain or axillae bilaterally.  Gait antalgic MSK:  Non tender with full range of motion and good stability and symmetric strength and tone of shoulders, elbows, wrist, hip, and ankles bilaterally.  Knee: Bilateral valgus deformity noted.  Abnormal thigh to calf ratio.  Tender to palpation over medial and PF joint line.  ROM full in flexion and extension and lower leg rotation. instability with valgus force.    painful patellar compression. Patellar glide with moderate crepitus. Patellar and quadriceps tendons unremarkable. Hamstring and quadriceps strength is normal.  After informed written and verbal consent, patient was seated on exam table. Right knee was prepped with alcohol swab and utilizing anterolateral approach, patient's right knee space was injected with 4:1  marcaine 0.5%: Kenalog 40mg /dL. Patient tolerated the procedure well without immediate complications.  After informed written and verbal consent, patient was seated on exam table. Left knee was prepped with alcohol swab and utilizing anterolateral approach, patient's left knee space was injected with 4:1  marcaine 0.5%: Kenalog 40mg /dL. Patient tolerated the procedure well without immediate complications.    Impression and Recommendations:     This case required medical decision making of moderate complexity. The above  documentation has been reviewed and is accurate and complete Lyndal Pulley, DO       Note: This dictation was prepared with Dragon dictation along with smaller phrase technology. Any transcriptional errors that result from this process are unintentional.

## 2018-03-18 ENCOUNTER — Ambulatory Visit: Payer: Medicare HMO | Admitting: Family Medicine

## 2018-03-18 ENCOUNTER — Encounter: Payer: Self-pay | Admitting: Family Medicine

## 2018-03-18 DIAGNOSIS — M170 Bilateral primary osteoarthritis of knee: Secondary | ICD-10-CM | POA: Diagnosis not present

## 2018-03-18 NOTE — Assessment & Plan Note (Signed)
Bilateral injections given today secondary to worsening problems.  Patient is having a biopsy of his prostate when he does not have any type of prostate cancer.  Could be causing some inflammation.  We discussed with patient about topical anti-inflammatories.  Patient was to potentially get a custom brace but decided against it.  Patient will try the over-the-counter braces that he has had previously.  We discussed icing regimen and home exercises.  Follow-up with me again in 10 weeks otherwise.  Can always be referred for surgical intervention if necessary.

## 2018-03-18 NOTE — Patient Instructions (Signed)
Sorry you are not feeling better Ice is your friend Stay active I would do more biking or elliptical  Injected the knees again today  I wish I had the magic wand.  See me again in 10 weeks

## 2018-04-03 DIAGNOSIS — R972 Elevated prostate specific antigen [PSA]: Secondary | ICD-10-CM | POA: Diagnosis not present

## 2018-04-03 DIAGNOSIS — C61 Malignant neoplasm of prostate: Secondary | ICD-10-CM | POA: Diagnosis not present

## 2018-04-09 DIAGNOSIS — C61 Malignant neoplasm of prostate: Secondary | ICD-10-CM | POA: Diagnosis not present

## 2018-04-13 DIAGNOSIS — C61 Malignant neoplasm of prostate: Secondary | ICD-10-CM | POA: Diagnosis not present

## 2018-04-14 ENCOUNTER — Encounter: Payer: Self-pay | Admitting: Radiation Oncology

## 2018-04-14 NOTE — Progress Notes (Signed)
GU Location of Tumor / Histology: prostatic adenocarcinoma  If Prostate Cancer, Gleason Score is (3 + 4) and PSA is (7). Prostate volume: 37.5 grams.   Colin Ina was referred by Dr. Cathlean Cower to Dr. Alyson Ingles in September 2019 for further evaluation of an elevated PSA.    Patient reports he was seen by [employer] in 2012 not 2016.   Biopsies of prostate (if applicable) revealed:    Past/Anticipated interventions by urology, if any: prostate biopsy, referral to Dr. Tresa Moore to discuss surgical options, referral to Dr. Tammi Klippel to discuss radiation treatment options  Past/Anticipated interventions by medical oncology, if any: no  Weight changes, if any: no  Bowel/Bladder complaints, if any: Denies urinary incontinence. Denies ED.  IPSS 13. SHIM 20.   Nausea/Vomiting, if any: no  Pain issues, if any:  Arthritis in both knees  SAFETY ISSUES:  Prior radiation? no  Pacemaker/ICD? no  Possible current pregnancy? no  Is the patient on methotrexate? no  Current Complaints / other details:  67 year old male. Married. Mother with hx of breast cancer and father colon. Resides in [place], 45 minutes away.

## 2018-04-15 ENCOUNTER — Encounter: Payer: Self-pay | Admitting: Radiation Oncology

## 2018-04-15 ENCOUNTER — Other Ambulatory Visit: Payer: Self-pay

## 2018-04-15 ENCOUNTER — Ambulatory Visit
Admission: RE | Admit: 2018-04-15 | Discharge: 2018-04-15 | Disposition: A | Discharge status: Home / Self Care | Payer: Medicare HMO | Source: Ambulatory Visit | Attending: Radiation Oncology | Admitting: Radiation Oncology

## 2018-04-15 VITALS — BP 104/73 | HR 80 | Temp 97.8°F | Resp 20 | Ht 74.0 in | Wt 185.4 lb

## 2018-04-15 DIAGNOSIS — Z808 Family history of malignant neoplasm of other organs or systems: Secondary | ICD-10-CM | POA: Diagnosis not present

## 2018-04-15 DIAGNOSIS — Z880 Allergy status to penicillin: Secondary | ICD-10-CM | POA: Insufficient documentation

## 2018-04-15 DIAGNOSIS — C61 Malignant neoplasm of prostate: Secondary | ICD-10-CM

## 2018-04-15 DIAGNOSIS — R972 Elevated prostate specific antigen [PSA]: Secondary | ICD-10-CM | POA: Diagnosis not present

## 2018-04-15 DIAGNOSIS — I10 Essential (primary) hypertension: Secondary | ICD-10-CM | POA: Diagnosis not present

## 2018-04-15 HISTORY — DX: Malignant neoplasm of prostate: C61

## 2018-04-15 NOTE — Progress Notes (Signed)
See progress note under physician encounter. 

## 2018-04-15 NOTE — Progress Notes (Signed)
Radiation Oncology         [phone]) [phone] ________________________________  Initial outpatient Consultation  Name: James Mullen MRN: [id number]  Date: 04/15/2018  DOB: 1950-12-22  DJ:MEQA, Hunt Oris, MD  McKenzie, Candee Furbish, MD   REFERRING PHYSICIAN: Cleon Gustin, MD  DIAGNOSIS: 67 y.o. gentleman with Stage T1c adenocarcinoma of the prostate with Gleason score of 3+4, and PSA of 7.    ICD-10-CM   1. Malignant neoplasm of prostate ([place]) C61     HISTORY OF PRESENT ILLNESS: James Mullen is a 67 y.o. male with a diagnosis of prostate cancer. Patient has a history of elevated PSA over the past 3-4 years. He was noted to have an elevated PSA of 7.03 by his primary care physician, Dr. Jenny Reichmann.  Accordingly, he was referred for evaluation in urology by Dr. Alyson Ingles on 03/05/18,  digital rectal examination was performed at that time revealing symmetrical lobes without discrete nodules.  The patient proceeded to transrectal ultrasound with 12 biopsies of the prostate on 04/03/18.  The prostate volume measured 37.5 cc.  Out of 12 core biopsies, 4 were positive.  The maximum Gleason score was 3+4, and this was seen in the right base lateral.  The patient reviewed the biopsy results with his urologist and he has kindly been referred today for discussion of potential radiation treatment options. He met with Dr. Tresa Moore on Monday 04/13/18 to discuss robotic prostatectomy.   PREVIOUS RADIATION THERAPY: No  PAST MEDICAL HISTORY:  Past Medical History:  Diagnosis Date  . ANEMIA-NOS 07/08/2008  . ANXIETY 07/08/2008   Patient denies.  Marland Kitchen BENIGN PROSTATIC HYPERTROPHY 07/08/2008  . GERD 07/08/2008  . HYPERTENSION 07/08/2008  . INSOMNIA-SLEEP DISORDER-UNSPEC 07/08/2008  . 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 PERIPHERAL VASCULAR DISEASE 07/08/2008  . Prostate cancer ([place])   . PSA, INCREASED 07/08/2008  . Sleep apnea    ok if patient sleeps on his side.      PAST SURGICAL HISTORY: Past Surgical History:  Procedure  Laterality Date  . COLONOSCOPY  multiple, last 03/08/2011   up until 2012: no adenomas 2012: 2 diminutive polyps  . ILIAC ARTERY ANEURYSM REPAIR  2007  . POLYPECTOMY    . [place]  . UVULOPALATOPHARYNGOPLASTY  1988    FAMILY HISTORY:  Family History  Problem Relation Age of Onset  . Colon cancer Father 73  . Breast cancer Mother   . Esophageal cancer Neg Hx   . Rectal cancer Neg Hx   . Stomach cancer Neg Hx   . Pancreatic cancer Neg Hx   . Prostate cancer Neg Hx     SOCIAL HISTORY:  Social History   Socioeconomic History  . Marital status: Single    Spouse name: Not on file  . Number of children: 1  . Years of education: Not on file  . Highest education level: Not on file  Occupational History  . Occupation: CFO   Social Needs  . Financial resource strain: Not on file  . Food insecurity:    Worry: Not on file    Inability: Not on file  . Transportation needs:    Medical: Not on file    Non-medical: Not on file  Tobacco Use  . Smoking status: Never Smoker  . Smokeless tobacco: Never Used  Substance and Sexual Activity  . Alcohol use: Yes    Alcohol/week: 2.0 standard drinks    Types: 1 Glasses of wine, 1 Cans of beer per week  .  Drug use: No  . Sexual activity: Yes  Lifestyle  . Physical activity:    Days per week: Not on file    Minutes per session: Not on file  . Stress: Not on file  Relationships  . Social connections:    Talks on phone: Not on file    Gets together: Not on file    Attends religious service: Not on file    Active member of club or organization: Not on file    Attends meetings of clubs or organizations: Not on file    Relationship status: Not on file  . Intimate partner violence:    Fear of current or ex partner: Not on file    Emotionally abused: Not on file    Physically abused: Not on file    Forced sexual activity: Not on file  Other Topics Concern  . Not on file  Social History Narrative    Retired in December 2018. 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    ALLERGIES: Penicillins  MEDICATIONS:  Current Outpatient Medications  Medication Sig Dispense Refill  . atenolol (TENORMIN) 50 MG tablet TAKE 1 TABLET BY MOUTH EVERY DAY **PHYSICAL NEEDED FOR ADDITIONAL REFILLS** 90 tablet 0  . Calcium Carbonate-Vitamin D (CALCIUM PLUS VITAMIN D PO) Take by mouth daily.    . Coenzyme Q10 (COQ10 PO) Take by mouth daily.    . Fish Oil OIL by Does not apply route daily.    . Glucosamine-Chondroitin-MSM 500-200-150 MG TABS Take 1 tablet by mouth daily.    . Nutritional Supplements (DHEA PO) Take by mouth daily.     Current Facility-Administered Medications  Medication Dose Route Frequency Provider Last Rate Last Dose  . 0.9 %  sodium chloride infusion  500 mL Intravenous Continuous Gatha Mayer, MD        REVIEW OF SYSTEMS:  On review of systems, the patient reports that he is doing well overall. He denies any chest pain, shortness of breath, cough, fevers, chills, night sweats, unintended weight changes. He denies any bowel disturbances, and denies abdominal pain, nausea or vomiting. He denies any new musculoskeletal or joint aches or pains. His IPSS was 13, indicating moderate urinary symptoms. His SHIM was 20, indicating he does not have erectile dysfunction. A complete review of systems is obtained and is otherwise negative.    PHYSICAL EXAM:  Wt Readings from Last 3 Encounters:  04/15/18 185 lb 6.4 oz (84.1 kg)  03/18/18 185 lb (83.9 kg)  01/28/18 185 lb (83.9 kg)   Temp Readings from Last 3 Encounters:  04/15/18 97.8 F (36.6 C) (Oral)  10/01/16 98.6 F (37 C) (Temporal)  09/27/14 98 F (36.7 C) (Oral)   BP Readings from Last 3 Encounters:  04/15/18 104/73  03/18/18 100/80  01/28/18 118/84   Pulse Readings from Last 3 Encounters:  04/15/18 80  03/18/18 73  01/28/18 60   Pain Assessment Pain Score: 0-No pain/10  In general this is a well appearing  Caucasian gentleman in no acute distress. He is alert and oriented x4 and appropriate throughout the examination. HEENT reveals that the patient is normocephalic, atraumatic. EOMs are intact. PERRLA. Skin is intact without any evidence of gross lesions. Cardiovascular exam reveals a regular rate and rhythm, no clicks rubs or murmurs are auscultated. Chest is clear to auscultation bilaterally. Lymphatic assessment is performed and does not reveal any adenopathy in the cervical, supraclavicular, axillary, or inguinal chains. Abdomen has active bowel sounds in all quadrants and  is intact. The abdomen is soft, non tender, non distended. Lower extremities are negative for pretibial pitting edema, deep calf tenderness, cyanosis or clubbing.   KPS = 100  100 - Normal; no complaints; no evidence of disease. 90   - Able to carry on normal activity; minor signs or symptoms of disease. 80   - Normal activity with effort; some signs or symptoms of disease. 70   - Cares for self; unable to carry on normal activity or to do active work. 60   - Requires occasional assistance, but is able to care for most of his personal needs. 50   - Requires considerable assistance and frequent medical care. 8   - Disabled; requires special care and assistance. 53   - Severely disabled; hospital admission is indicated although death not imminent. 30   - Very sick; hospital admission necessary; active supportive treatment necessary. 10   - Moribund; fatal processes progressing rapidly. 0     - Dead  Karnofsky DA, Abelmann [place], Craver LS and Burchenal [facility] [phone]) The use of the nitrogen mustards in the palliative treatment of carcinoma: with particular reference to bronchogenic carcinoma Cancer 1 634-56  LABORATORY DATA:  Lab Results  Component Value Date   WBC 6.4 11/27/2016   HGB 15.5 11/27/2016   HCT 45.4 11/27/2016   MCV 83.1 11/27/2016   PLT 150.0 11/27/2016   Lab Results  Component Value Date   NA 139 11/27/2016    K 4.2 11/27/2016   CL 104 11/27/2016   CO2 32 11/27/2016   Lab Results  Component Value Date   ALT 20 11/27/2016   AST 18 11/27/2016   ALKPHOS 58 11/27/2016   BILITOT 0.6 11/27/2016     RADIOGRAPHY: No results found.    IMPRESSION/PLAN: 1. 67 y.o. gentleman with Stage T1c adenocarcinoma of the prostate with Gleason Score of 3+4, and PSA of 7. We discussed the patient's workup and outlined the nature of prostate cancer in this setting. The patient's T stage, Gleason's score, and PSA put him into the favorable intermediate risk group. Accordingly, he is eligible for a variety of potential treatment options including brachytherapy, 5.5-8 weeks of external radiation or 5 weeks of external radiation followed by a brachytherapy boost. We discussed the available radiation techniques, and focused on the details and logistics and delivery.  We discussed and outlined the risks, benefits, short and long-term effects associated with radiotherapy and compared and contrasted these with prostatectomy. We discussed the role of SpaceOAR in reducing the rectal toxicity associated with radiotherapy.   At the end of the conversation the patient is interested in moving forward with brachytherapy and use of SpaceOAR to reduce rectal toxicity from radiotherapy.  We will share our discussion with Dr. Alyson Ingles and move forward with scheduling his CT [facility] planning appointment in the near future.  The patient met briefly with Romie Jumper in our office who will be working closely with him to coordinate OR scheduling and pre and post procedure appointments.  We will contact the pharmaceutical rep to ensure that [employer] is available at the time of procedure.  He will have a prostate MRI following his post-seed CT SIM to confirm appropriate distribution of the [place]..  We spent 60 minutes face to face with the patient and more than 50% of that time was spent in counseling and/or coordination of care.    Nicholos Johns, PA-C    Tyler Pita, MD  [facility] Direct Dial: [phone]  Fax: [phone] [email or web address]  Skype  [email or web address]   This document serves as a record of services personally performed by Tyler Pita, MD and Freeman Caldron, PA-C. It was created on their behalf by Wilburn Mylar, a trained medical scribe. The creation of this record is based on the scribe's personal observations and the provider's statements to them. This document has been checked and approved by the attending provider.

## 2018-04-16 DIAGNOSIS — C61 Malignant neoplasm of prostate: Secondary | ICD-10-CM | POA: Insufficient documentation

## 2018-04-21 ENCOUNTER — Other Ambulatory Visit: Payer: Self-pay | Admitting: Urology

## 2018-04-21 ENCOUNTER — Telehealth: Payer: Self-pay | Admitting: *Deleted

## 2018-04-21 NOTE — Telephone Encounter (Signed)
CALLED PATIENT TO INFORM OF PRE-SEED PLANNING CT AND IMPLANT, SPOKE WITH PATIENT AND HE IS AWARE OF THESE APPTS.

## 2018-04-28 DIAGNOSIS — M25531 Pain in right wrist: Secondary | ICD-10-CM | POA: Diagnosis not present

## 2018-04-28 DIAGNOSIS — M2550 Pain in unspecified joint: Secondary | ICD-10-CM | POA: Diagnosis not present

## 2018-04-28 DIAGNOSIS — M1712 Unilateral primary osteoarthritis, left knee: Secondary | ICD-10-CM | POA: Diagnosis not present

## 2018-04-28 DIAGNOSIS — M25562 Pain in left knee: Secondary | ICD-10-CM | POA: Diagnosis not present

## 2018-04-28 DIAGNOSIS — Z79899 Other long term (current) drug therapy: Secondary | ICD-10-CM | POA: Diagnosis not present

## 2018-04-28 DIAGNOSIS — M1711 Unilateral primary osteoarthritis, right knee: Secondary | ICD-10-CM | POA: Diagnosis not present

## 2018-04-28 DIAGNOSIS — M25512 Pain in left shoulder: Secondary | ICD-10-CM | POA: Diagnosis not present

## 2018-04-28 DIAGNOSIS — Z880 Allergy status to penicillin: Secondary | ICD-10-CM | POA: Diagnosis not present

## 2018-04-28 DIAGNOSIS — I714 Abdominal aortic aneurysm, without rupture: Secondary | ICD-10-CM | POA: Diagnosis not present

## 2018-04-28 DIAGNOSIS — M25461 Effusion, right knee: Secondary | ICD-10-CM | POA: Diagnosis not present

## 2018-04-28 DIAGNOSIS — M25561 Pain in right knee: Secondary | ICD-10-CM | POA: Diagnosis not present

## 2018-05-20 ENCOUNTER — Telehealth: Payer: Self-pay | Admitting: *Deleted

## 2018-05-20 NOTE — Progress Notes (Signed)
  Radiation Oncology         [phone] ________________________________  Name: James Mullen MRN: [id number]  Date: 05/21/2018  DOB: 04/07/1951  SIMULATION AND TREATMENT PLANNING NOTE PUBIC ARCH STUDY  QM:GQQP, Hunt Oris, MD  Biagio Borg, MD  DIAGNOSIS: 67 y.o. gentleman with Stage T1c adenocarcinoma of the prostate with Gleason score of 3+4, and PSA of 7     ICD-10-CM   1. Malignant neoplasm of prostate ([place]) C61     COMPLEX SIMULATION:  The patient presented today for evaluation for possible prostate seed implant. He was brought to the radiation planning suite and placed supine on the CT couch. A 3-dimensional image study set was obtained in upload to the planning computer. There, on each axial slice, I contoured the prostate gland. Then, using three-dimensional radiation planning tools I reconstructed the prostate in view of the structures from the transperineal needle pathway to assess for possible pubic arch interference. In doing so, I did not appreciate any pubic arch interference. Also, the patient's prostate volume was estimated based on the drawn structure. The volume was 37 cc.  Given the pubic arch appearance and prostate volume, patient remains a good candidate to proceed with prostate seed implant. Today, he freely provided informed written consent to proceed.    PLAN: The patient will undergo prostate seed implant.   ________________________________  Sheral Apley. Tammi Klippel, M.D.   This document serves as a record of services personally performed by Tyler Pita, MD. It was created on his behalf by Wilburn Mylar, a trained medical scribe. The creation of this record is based on the scribe's personal observations and the provider's statements to them. This document has been checked and approved by the attending provider.

## 2018-05-20 NOTE — Telephone Encounter (Signed)
CALLED PATIENT TO INFORM OF PRE-SEED APPTS. AND CHEST X-RAY AND EKG FOR 05-21-18, SPOKE WITH PATIENT AND HE IS AWARE OF THESE APPTS.

## 2018-05-21 ENCOUNTER — Ambulatory Visit
Admission: RE | Admit: 2018-05-21 | Discharge: 2018-05-21 | Disposition: A | Discharge status: Home / Self Care | Payer: Medicare HMO | Source: Ambulatory Visit | Attending: Radiation Oncology | Admitting: Radiation Oncology

## 2018-05-21 ENCOUNTER — Encounter: Payer: Self-pay | Admitting: Medical Oncology

## 2018-05-21 ENCOUNTER — Ambulatory Visit (HOSPITAL_COMMUNITY)
Admission: RE | Admit: 2018-05-21 | Discharge: 2018-05-21 | Disposition: A | Discharge status: Home / Self Care | Payer: Medicare HMO | Source: Ambulatory Visit | Attending: Urology | Admitting: Urology

## 2018-05-21 ENCOUNTER — Encounter (HOSPITAL_COMMUNITY)
Admission: RE | Admit: 2018-05-21 | Discharge: 2018-05-21 | Disposition: A | Discharge status: Home / Self Care | Payer: Medicare HMO | Source: Ambulatory Visit | Attending: Urology | Admitting: Urology

## 2018-05-21 VITALS — BP 111/80 | HR 67 | Temp 97.8°F | Resp 20 | Ht 74.0 in | Wt 186.2 lb

## 2018-05-21 DIAGNOSIS — C61 Malignant neoplasm of prostate: Secondary | ICD-10-CM

## 2018-05-21 DIAGNOSIS — Z01818 Encounter for other preprocedural examination: Secondary | ICD-10-CM | POA: Diagnosis not present

## 2018-05-21 DIAGNOSIS — Z01811 Encounter for preprocedural respiratory examination: Secondary | ICD-10-CM

## 2018-05-21 DIAGNOSIS — J984 Other disorders of lung: Secondary | ICD-10-CM | POA: Diagnosis not present

## 2018-05-21 DIAGNOSIS — R0602 Shortness of breath: Secondary | ICD-10-CM | POA: Diagnosis not present

## 2018-05-27 ENCOUNTER — Other Ambulatory Visit (INDEPENDENT_AMBULATORY_CARE_PROVIDER_SITE_OTHER): Payer: Medicare HMO

## 2018-05-27 ENCOUNTER — Encounter: Payer: Self-pay | Admitting: Internal Medicine

## 2018-05-27 ENCOUNTER — Ambulatory Visit (INDEPENDENT_AMBULATORY_CARE_PROVIDER_SITE_OTHER): Payer: Medicare HMO | Admitting: Internal Medicine

## 2018-05-27 VITALS — BP 106/74 | HR 97 | Temp 97.8°F | Ht 74.0 in | Wt 187.0 lb

## 2018-05-27 DIAGNOSIS — Z0001 Encounter for general adult medical examination with abnormal findings: Secondary | ICD-10-CM

## 2018-05-27 DIAGNOSIS — F419 Anxiety disorder, unspecified: Secondary | ICD-10-CM

## 2018-05-27 DIAGNOSIS — F329 Major depressive disorder, single episode, unspecified: Secondary | ICD-10-CM

## 2018-05-27 DIAGNOSIS — M25562 Pain in left knee: Secondary | ICD-10-CM | POA: Diagnosis not present

## 2018-05-27 DIAGNOSIS — M25561 Pain in right knee: Secondary | ICD-10-CM | POA: Diagnosis not present

## 2018-05-27 DIAGNOSIS — I10 Essential (primary) hypertension: Secondary | ICD-10-CM

## 2018-05-27 DIAGNOSIS — F32A Depression, unspecified: Secondary | ICD-10-CM

## 2018-05-27 DIAGNOSIS — R69 Illness, unspecified: Secondary | ICD-10-CM | POA: Diagnosis not present

## 2018-05-27 LAB — HEPATIC FUNCTION PANEL
ALK PHOS: 54 U/L (ref 39–117)
ALT: 11 U/L (ref 0–53)
AST: 13 U/L (ref 0–37)
Albumin: 4.2 g/dL (ref 3.5–5.2)
BILIRUBIN DIRECT: 0.1 mg/dL (ref 0.0–0.3)
BILIRUBIN TOTAL: 0.6 mg/dL (ref 0.2–1.2)
Total Protein: 6.9 g/dL (ref 6.0–8.3)

## 2018-05-27 LAB — URINALYSIS, ROUTINE W REFLEX MICROSCOPIC
BILIRUBIN URINE: NEGATIVE
HGB URINE DIPSTICK: NEGATIVE
KETONES UR: NEGATIVE
Nitrite: NEGATIVE
Specific Gravity, Urine: 1.01 (ref 1.000–1.030)
Total Protein, Urine: NEGATIVE
Urine Glucose: NEGATIVE
Urobilinogen, UA: 0.2 (ref 0.0–1.0)
pH: 7 (ref 5.0–8.0)

## 2018-05-27 LAB — BASIC METABOLIC PANEL
BUN: 15 mg/dL (ref 6–23)
CALCIUM: 9.7 mg/dL (ref 8.4–10.5)
CO2: 31 mEq/L (ref 19–32)
Chloride: 104 mEq/L (ref 96–112)
Creatinine, Ser: 0.9 mg/dL (ref 0.40–1.50)
GFR: 89.64 mL/min (ref >60.00)
GLUCOSE: 107 mg/dL — AB (ref 70–99)
Potassium: 4.1 mEq/L (ref 3.5–5.1)
Sodium: 142 mEq/L (ref 135–145)

## 2018-05-27 LAB — CBC WITH DIFFERENTIAL/PLATELET
BASOS ABS: 0 10*3/uL (ref 0.0–0.1)
Basophils Relative: 0.5 % (ref 0.0–3.0)
EOS ABS: 0.1 10*3/uL (ref 0.0–0.7)
EOS PCT: 1 % (ref 0.0–5.0)
HCT: 45 % (ref 39.0–52.0)
HEMOGLOBIN: 15 g/dL (ref 13.0–17.0)
Lymphocytes Relative: 27.8 % (ref 12.0–46.0)
Lymphs Abs: 1.5 10*3/uL (ref 0.7–4.0)
MCHC: 33.4 g/dL (ref 30.0–36.0)
MCV: 84.8 fl (ref 78.0–100.0)
MONO ABS: 0.6 10*3/uL (ref 0.1–1.0)
Monocytes Relative: 10.5 % (ref 3.0–12.0)
Neutro Abs: 3.2 10*3/uL (ref 1.4–7.7)
Neutrophils Relative %: 60.2 % (ref 43.0–77.0)
Platelets: 149 10*3/uL — ABNORMAL LOW (ref 150.0–400.0)
RBC: 5.3 Mil/uL (ref 4.22–5.81)
RDW: 13.9 % (ref 11.5–15.5)
WBC: 5.4 10*3/uL (ref 4.0–10.5)

## 2018-05-27 LAB — TSH: TSH: 1.65 u[IU]/mL (ref 0.35–4.50)

## 2018-05-27 LAB — LIPID PANEL
CHOL/HDL RATIO: 3
Cholesterol: 145 mg/dL (ref 0–200)
HDL: 43.7 mg/dL (ref >39.00)
LDL Cholesterol: 76 mg/dL (ref 0–99)
NONHDL: 101.19
Triglycerides: 126 mg/dL (ref 0.0–149.0)
VLDL: 25.2 mg/dL (ref 0.0–40.0)

## 2018-05-27 MED ORDER — ZOSTER VAC RECOMB ADJUVANTED 50 MCG/0.5ML IM SUSR: 0.5000 mL | Freq: Once | INTRAMUSCULAR | 1 refills | Status: AC

## 2018-05-27 MED ORDER — VORTIOXETINE HBR 20 MG PO TABS: 20.0000 mg | ORAL_TABLET | Freq: Every day | ORAL | 3 refills | Status: DC

## 2018-05-27 MED ORDER — ATENOLOL 50 MG PO TABS: ORAL_TABLET | ORAL | 3 refills | Status: DC

## 2018-05-27 NOTE — Progress Notes (Signed)
Subjective:    Patient ID: James Mullen, male    DOB: Jul 10, 1950, 67 y.o.   MRN: [id number]  HPI   Here for wellness and f/u;  Overall doing ok;  Pt denies Chest pain, worsening SOB, DOE, wheezing, orthopnea, PND, worsening LE edema, palpitations, dizziness or syncope.  Pt denies neurological change such as new headache, facial or extremity weakness.  Pt denies polydipsia, polyuria, or low sugar symptoms. Pt states overall good compliance with treatment and medications, good tolerability, and has been trying to follow appropriate diet.  No fever, night sweats, wt loss, loss of appetite, or other constitutional symptoms.  Pt states good ability with ADL's, has low fall risk, home safety reviewed and adequate, no other significant changes in hearing or vision, and only occasionally active with exercise.  Has recently dx prostate ca, for seed implant xrt in dec 2019.  Had cxr, ecg, lab preop done and pending.  Also c/o bilat knees pain - tx per Dr Tamala Julian not helped, seen at ED 3 wks ago, may need right TKR soon per pt?; has made more "bummed out" and nervous with the pain.  Pt does c/o mild to mod worsening 1-2 mo acute persistent depressive symptoms and nerves, but no suicidal ideation or panic.   Past Medical History:  Diagnosis Date  . ANEMIA-NOS 07/08/2008  . ANXIETY 07/08/2008   Patient denies.  Marland Kitchen BENIGN PROSTATIC HYPERTROPHY 07/08/2008  . GERD 07/08/2008  . HYPERTENSION 07/08/2008  . INSOMNIA-SLEEP DISORDER-UNSPEC 07/08/2008  . 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 PERIPHERAL VASCULAR DISEASE 07/08/2008  . Prostate cancer ([place])   . PSA, INCREASED 07/08/2008  . Sleep apnea    ok if patient sleeps on his side.   Past Surgical History:  Procedure Laterality Date  . COLONOSCOPY  multiple, last 03/08/2011   up until 2012: no adenomas 2012: 2 diminutive polyps  . ILIAC ARTERY ANEURYSM REPAIR  2007  . POLYPECTOMY    . [place]  . UVULOPALATOPHARYNGOPLASTY  1988    reports that  he has never smoked. He has never used smokeless tobacco. He reports that he drinks about 2.0 standard drinks of alcohol per week. He reports that he does not use drugs. family history includes Breast cancer in his mother; Colon cancer (age of onset: 81) in his father. Allergies  Allergen Reactions  . Penicillins Hives   Current Outpatient Medications on File Prior to Visit  Medication Sig Dispense Refill  . Calcium Carbonate-Vitamin D (CALCIUM PLUS VITAMIN D PO) Take by mouth daily.    . Coenzyme Q10 (COQ10 PO) Take by mouth daily.    . Fish Oil OIL by Does not apply route daily.    . Glucosamine-Chondroitin-MSM 500-200-150 MG TABS Take 1 tablet by mouth daily.    . Nutritional Supplements (DHEA PO) Take by mouth daily.     No current facility-administered medications on file prior to visit.    Review of Systems Constitutional: Negative for other unusual diaphoresis, sweats, appetite or weight changes HENT: Negative for other worsening hearing loss, ear pain, facial swelling, mouth sores or neck stiffness.   Eyes: Negative for other worsening pain, redness or other visual disturbance.  Respiratory: Negative for other stridor or swelling Cardiovascular: Negative for other palpitations or other chest pain  Gastrointestinal: Negative for worsening diarrhea or loose stools, blood in stool, distention or other pain Genitourinary: Negative for hematuria, flank pain or other change in urine volume.  Musculoskeletal: Negative for myalgias or other  joint swelling.  Skin: Negative for other color change, or other wound or worsening drainage.  Neurological: Negative for other syncope or numbness. Hematological: Negative for other adenopathy or swelling Psychiatric/Behavioral: Negative for hallucinations, other worsening agitation, SI, self-injury, or new decreased concentration All other system neg per pt    Objective:   Physical Exam BP 106/74   Pulse 97   Temp 97.8 F (36.6 C) (Oral)    Ht 6\' 2"  (1.88 m)   Wt 187 lb (84.8 kg)   SpO2 96%   BMI 24.01 kg/m  VS noted,  Constitutional: Pt is oriented to person, place, and time. Appears well-developed and well-nourished, in no significant distress and comfortable Head: Normocephalic and atraumatic  Eyes: Conjunctivae and EOM are normal. Pupils are equal, round, and reactive to light Right Ear: External ear normal without discharge Left Ear: External ear normal without discharge Nose: Nose without discharge or deformity Mouth/Throat: Oropharynx is without other ulcerations and moist  Neck: Normal range of motion. Neck supple. No JVD present. No tracheal deviation present or significant neck LA or mass Cardiovascular: Normal rate, regular rhythm, normal heart sounds and intact distal pulses.   Pulmonary/Chest: WOB normal and breath sounds without rales or wheezing  Abdominal: Soft. Bowel sounds are normal. NT. No HSM  Musculoskeletal: Normal range of motion. Exhibits no edema, bilat knees with right > left crepitus, trace effusion Lymphadenopathy: Has no other cervical adenopathy.  Neurological: Pt is alert and oriented to person, place, and time. Pt has normal reflexes. No cranial nerve deficit. Motor grossly intact, Gait intact Skin: Skin is warm and dry. No rash noted or new ulcerations Psychiatric:  Has nervous depressed mood and affect. Behavior is normal without agitation No other exam findings Lab Results  Component Value Date   WBC 5.4 05/27/2018   HGB 15.0 05/27/2018   HCT 45.0 05/27/2018   PLT 149.0 (L) 05/27/2018   GLUCOSE 107 (H) 05/27/2018   CHOL 145 05/27/2018   TRIG 126.0 05/27/2018   HDL 43.70 05/27/2018   LDLDIRECT 98.0 11/27/2016   LDLCALC 76 05/27/2018   ALT 11 05/27/2018   AST 13 05/27/2018   NA 142 05/27/2018   K 4.1 05/27/2018   CL 104 05/27/2018   CREATININE 0.90 05/27/2018   BUN 15 05/27/2018   CO2 31 05/27/2018   TSH 1.65 05/27/2018   PSA 7.03 (H) 01/21/2018   \    Assessment & Plan:

## 2018-05-27 NOTE — Patient Instructions (Addendum)
Your  Shingles shot prescription was sent to the pharmacy  Please take all new medication as prescribed - the trintillix  Please continue all other medications as before, and refills have been done if requested - the atenolol  Please have the pharmacy call with any other refills you may need.  Please continue your efforts at being more active, low cholesterol diet, and weight control.  You are otherwise up to date with prevention measures today.  Please keep your appointments with your specialists as you may have planned - Dr Tamala Julian for the knees  Please go to the LAB in the Basement (turn left off the elevator) for the tests to be done today  You will be contacted by phone if any changes need to be made immediately.  Otherwise, you will receive a letter about your results with an explanation, but please check with MyChart first.  Please remember to sign up for MyChart if you have not done so, as this will be important to you in the future with finding out test results, communicating by private email, and scheduling acute appointments online when needed.  Please return in 6 months, or sooner if needed

## 2018-05-28 NOTE — Assessment & Plan Note (Signed)
stable overall by history and exam, recent data reviewed with pt, and pt to continue medical treatment as before,  to f/u any worsening symptoms or concerns  

## 2018-05-28 NOTE — Assessment & Plan Note (Signed)

## 2018-05-28 NOTE — Assessment & Plan Note (Addendum)
Mild to mod, for trintillix 10 qd,  to f/u any worsening symptoms or concerns, declines referral for counseling or psychiatry  In addition to the time spent performing CPE, I spent an additional 25 minutes face to face,in which greater than 50% of this time was spent in counseling and coordination of care for patient's acute illness as documented, including the differential dx, treatment, further evaluation and other management of anxiety and depression, bilat knee pain, HTN

## 2018-05-28 NOTE — Assessment & Plan Note (Signed)
Suspect he has now end stage djd after multiple cortisone and gel injections; he wants to f/u with Dr Smith/sports medicine before will consider ortho and possible need for surgury

## 2018-06-05 ENCOUNTER — Other Ambulatory Visit: Payer: Self-pay | Admitting: Urology

## 2018-06-05 DIAGNOSIS — C61 Malignant neoplasm of prostate: Secondary | ICD-10-CM

## 2018-06-17 ENCOUNTER — Encounter (HOSPITAL_BASED_OUTPATIENT_CLINIC_OR_DEPARTMENT_OTHER): Payer: Self-pay | Admitting: *Deleted

## 2018-06-17 ENCOUNTER — Other Ambulatory Visit: Payer: Self-pay

## 2018-06-17 NOTE — Progress Notes (Signed)
Spoke with patient via telephone for pre op interview. NPO after MN. Fleets enema AM of surgery. Patient to take tenormin AM of surgery with a sip of water. Arrival time 0930.

## 2018-06-22 ENCOUNTER — Encounter (HOSPITAL_COMMUNITY)
Admission: RE | Admit: 2018-06-22 | Discharge: 2018-06-22 | Disposition: A | Discharge status: Home / Self Care | Payer: Medicare HMO | Source: Ambulatory Visit | Attending: Urology | Admitting: Urology

## 2018-06-22 DIAGNOSIS — Z01812 Encounter for preprocedural laboratory examination: Secondary | ICD-10-CM | POA: Insufficient documentation

## 2018-06-22 LAB — COMPREHENSIVE METABOLIC PANEL
ALT: 16 U/L (ref 0–44)
AST: 18 U/L (ref 15–41)
Albumin: 4.2 g/dL (ref 3.5–5.0)
Alkaline Phosphatase: 50 U/L (ref 38–126)
Anion gap: 8 (ref 5–15)
BILIRUBIN TOTAL: 1 mg/dL (ref 0.3–1.2)
BUN: 16 mg/dL (ref 8–23)
CO2: 28 mmol/L (ref 22–32)
Calcium: 9.4 mg/dL (ref 8.9–10.3)
Chloride: 106 mmol/L (ref 98–111)
Creatinine, Ser: 0.85 mg/dL (ref 0.61–1.24)
Glucose, Bld: 83 mg/dL (ref 70–99)
POTASSIUM: 4.2 mmol/L (ref 3.5–5.1)
Sodium: 142 mmol/L (ref 135–145)
Total Protein: 6.6 g/dL (ref 6.5–8.1)

## 2018-06-22 LAB — APTT: aPTT: 29 seconds (ref 24–36)

## 2018-06-22 LAB — CBC
HEMATOCRIT: 46.3 % (ref 39.0–52.0)
Hemoglobin: 15.1 g/dL (ref 13.0–17.0)
MCH: 27.6 pg (ref 26.0–34.0)
MCHC: 32.6 g/dL (ref 30.0–36.0)
MCV: 84.6 fL (ref 80.0–100.0)
NRBC: 0 % (ref 0.0–0.2)
PLATELETS: 149 10*3/uL — AB (ref 150–400)
RBC: 5.47 MIL/uL (ref 4.22–5.81)
RDW: 13.2 % (ref 11.5–15.5)
WBC: 5.1 10*3/uL (ref 4.0–10.5)

## 2018-06-22 LAB — PROTIME-INR
INR: 0.97
PROTHROMBIN TIME: 12.8 s (ref 11.4–15.2)

## 2018-06-26 ENCOUNTER — Telehealth: Payer: Self-pay | Admitting: *Deleted

## 2018-06-26 NOTE — Telephone Encounter (Signed)
CALLED PATIENT TO REMIND OF IMPLANT ON 06-29-18, LVM FOR A RETURN CALL

## 2018-06-29 ENCOUNTER — Encounter (HOSPITAL_BASED_OUTPATIENT_CLINIC_OR_DEPARTMENT_OTHER): Payer: Self-pay | Admitting: *Deleted

## 2018-06-29 ENCOUNTER — Ambulatory Visit (HOSPITAL_BASED_OUTPATIENT_CLINIC_OR_DEPARTMENT_OTHER): Payer: Medicare HMO | Admitting: Anesthesiology

## 2018-06-29 ENCOUNTER — Encounter (HOSPITAL_BASED_OUTPATIENT_CLINIC_OR_DEPARTMENT_OTHER)
Admission: RE | Disposition: A | Discharge status: Home / Self Care | Payer: Self-pay | Source: Ambulatory Visit | Attending: Urology

## 2018-06-29 ENCOUNTER — Ambulatory Visit (HOSPITAL_COMMUNITY): Payer: Medicare HMO

## 2018-06-29 ENCOUNTER — Ambulatory Visit (HOSPITAL_BASED_OUTPATIENT_CLINIC_OR_DEPARTMENT_OTHER)
Admission: RE | Admit: 2018-06-29 | Discharge: 2018-06-29 | Disposition: A | Discharge status: Home / Self Care | Payer: Medicare HMO | Source: Ambulatory Visit | Attending: Urology | Admitting: Urology

## 2018-06-29 DIAGNOSIS — C61 Malignant neoplasm of prostate: Secondary | ICD-10-CM | POA: Insufficient documentation

## 2018-06-29 DIAGNOSIS — I739 Peripheral vascular disease, unspecified: Secondary | ICD-10-CM | POA: Insufficient documentation

## 2018-06-29 DIAGNOSIS — Z79899 Other long term (current) drug therapy: Secondary | ICD-10-CM | POA: Insufficient documentation

## 2018-06-29 DIAGNOSIS — K219 Gastro-esophageal reflux disease without esophagitis: Secondary | ICD-10-CM | POA: Diagnosis not present

## 2018-06-29 DIAGNOSIS — G4730 Sleep apnea, unspecified: Secondary | ICD-10-CM | POA: Diagnosis not present

## 2018-06-29 DIAGNOSIS — I10 Essential (primary) hypertension: Secondary | ICD-10-CM | POA: Insufficient documentation

## 2018-06-29 DIAGNOSIS — R69 Illness, unspecified: Secondary | ICD-10-CM | POA: Diagnosis not present

## 2018-06-29 HISTORY — PX: SPACE OAR INSTILLATION: SHX6769

## 2018-06-29 HISTORY — PX: RADIOACTIVE SEED IMPLANT: SHX5150

## 2018-06-29 SURGERY — INSERTION, RADIATION SOURCE, PROSTATE
Anesthesia: General | Site: Rectum

## 2018-06-29 MED ORDER — TRAMADOL HCL 50 MG PO TABS: 50.0000 mg | ORAL_TABLET | Freq: Four times a day (QID) | ORAL | 0 refills | Status: DC | PRN

## 2018-06-29 MED ORDER — PROPOFOL 10 MG/ML IV BOLUS
INTRAVENOUS | Status: AC
  Filled 2018-06-29: qty 40

## 2018-06-29 MED ORDER — DEXAMETHASONE SODIUM PHOSPHATE 10 MG/ML IJ SOLN
INTRAMUSCULAR | Status: AC
  Filled 2018-06-29: qty 1

## 2018-06-29 MED ORDER — ONDANSETRON HCL 4 MG/2ML IJ SOLN
INTRAMUSCULAR | Status: AC
  Filled 2018-06-29: qty 2

## 2018-06-29 MED ORDER — FENTANYL CITRATE (PF) 100 MCG/2ML IJ SOLN
25.0000 ug | INTRAMUSCULAR | Status: DC | PRN
  Filled 2018-06-29: qty 1

## 2018-06-29 MED ORDER — CEFAZOLIN SODIUM-DEXTROSE 2-4 GM/100ML-% IV SOLN
2.0000 g | Freq: Once | INTRAVENOUS | Status: AC
  Administered 2018-06-29: 2 g via INTRAVENOUS
  Filled 2018-06-29: qty 100

## 2018-06-29 MED ORDER — LIDOCAINE 2% (20 MG/ML) 5 ML SYRINGE
INTRAMUSCULAR | Status: DC | PRN
  Administered 2018-06-29: 100 mg via INTRAVENOUS

## 2018-06-29 MED ORDER — SODIUM CHLORIDE (PF) 0.9 % IJ SOLN
INTRAMUSCULAR | Status: DC | PRN
  Administered 2018-06-29: 3 mL

## 2018-06-29 MED ORDER — MIDAZOLAM HCL 2 MG/2ML IJ SOLN
INTRAMUSCULAR | Status: AC
  Filled 2018-06-29: qty 2

## 2018-06-29 MED ORDER — LACTATED RINGERS IV SOLN
INTRAVENOUS | Status: DC
  Administered 2018-06-29 (×2): via INTRAVENOUS
  Filled 2018-06-29: qty 1000

## 2018-06-29 MED ORDER — MIDAZOLAM HCL 2 MG/2ML IJ SOLN
INTRAMUSCULAR | Status: DC | PRN
  Administered 2018-06-29: 2 mg via INTRAVENOUS

## 2018-06-29 MED ORDER — SODIUM CHLORIDE 0.9 % IV SOLN
INTRAVENOUS | Status: AC | PRN
  Administered 2018-06-29: 1000 mL

## 2018-06-29 MED ORDER — IOHEXOL 300 MG/ML  SOLN
INTRAMUSCULAR | Status: DC | PRN
  Administered 2018-06-29: 7 mL

## 2018-06-29 MED ORDER — KETOROLAC TROMETHAMINE 30 MG/ML IJ SOLN
INTRAMUSCULAR | Status: AC
  Filled 2018-06-29: qty 1

## 2018-06-29 MED ORDER — ONDANSETRON HCL 4 MG/2ML IJ SOLN: INTRAMUSCULAR | Status: DC | PRN

## 2018-06-29 MED ORDER — ACETAMINOPHEN 10 MG/ML IV SOLN
1000.0000 mg | Freq: Once | INTRAVENOUS | Status: DC | PRN
  Filled 2018-06-29: qty 100

## 2018-06-29 MED ORDER — LIDOCAINE 2% (20 MG/ML) 5 ML SYRINGE
INTRAMUSCULAR | Status: AC
  Filled 2018-06-29: qty 5

## 2018-06-29 MED ORDER — OXYCODONE HCL 5 MG PO TABS
5.0000 mg | ORAL_TABLET | Freq: Once | ORAL | Status: DC | PRN
  Filled 2018-06-29: qty 1

## 2018-06-29 MED ORDER — DEXAMETHASONE SODIUM PHOSPHATE 10 MG/ML IJ SOLN
INTRAMUSCULAR | Status: DC | PRN
  Administered 2018-06-29: 5 mg via INTRAVENOUS

## 2018-06-29 MED ORDER — CEFAZOLIN SODIUM-DEXTROSE 2-4 GM/100ML-% IV SOLN
INTRAVENOUS | Status: AC
  Filled 2018-06-29: qty 100

## 2018-06-29 MED ORDER — FLEET ENEMA 7-19 GM/118ML RE ENEM
1.0000 | ENEMA | Freq: Once | RECTAL | Status: DC
  Filled 2018-06-29: qty 1

## 2018-06-29 MED ORDER — OXYCODONE HCL 5 MG/5ML PO SOLN
5.0000 mg | Freq: Once | ORAL | Status: DC | PRN
  Filled 2018-06-29: qty 5

## 2018-06-29 MED ORDER — PROPOFOL 10 MG/ML IV BOLUS
INTRAVENOUS | Status: DC | PRN
  Administered 2018-06-29: 160 mg via INTRAVENOUS

## 2018-06-29 MED ORDER — FENTANYL CITRATE (PF) 100 MCG/2ML IJ SOLN
INTRAMUSCULAR | Status: AC
  Filled 2018-06-29: qty 2

## 2018-06-29 MED ORDER — ONDANSETRON HCL 4 MG/2ML IJ SOLN
INTRAMUSCULAR | Status: DC | PRN
  Administered 2018-06-29: 4 mg via INTRAVENOUS

## 2018-06-29 MED ORDER — FENTANYL CITRATE (PF) 100 MCG/2ML IJ SOLN
INTRAMUSCULAR | Status: DC | PRN
  Administered 2018-06-29 (×2): 50 ug via INTRAVENOUS

## 2018-06-29 MED ORDER — KETOROLAC TROMETHAMINE 30 MG/ML IJ SOLN
INTRAMUSCULAR | Status: DC | PRN
  Administered 2018-06-29: 30 mg via INTRAVENOUS

## 2018-06-29 SURGICAL SUPPLY — 46 items
BAG URINE DRAINAGE (UROLOGICAL SUPPLIES) ×3 IMPLANT
BLADE CLIPPER SURG (BLADE) ×3 IMPLANT
CATH FOLEY 2WAY SLVR  5CC 16FR (CATHETERS) ×2
CATH FOLEY 2WAY SLVR 5CC 16FR (CATHETERS) ×4 IMPLANT
CATH ROBINSON RED A/P 20FR (CATHETERS) ×3 IMPLANT
CLOTH BEACON ORANGE TIMEOUT ST (SAFETY) ×3 IMPLANT
CONT SPECI 4OZ STER CLIK (MISCELLANEOUS) ×6 IMPLANT
COVER BACK TABLE 60X90IN (DRAPES) ×3 IMPLANT
COVER MAYO STAND STRL (DRAPES) ×3 IMPLANT
COVER WAND RF STERILE (DRAPES) ×3 IMPLANT
DRSG TEGADERM 4X4.75 (GAUZE/BANDAGES/DRESSINGS) ×6 IMPLANT
DRSG TEGADERM 8X12 (GAUZE/BANDAGES/DRESSINGS) ×3 IMPLANT
GAUZE SPONGE 4X4 12PLY STRL (GAUZE/BANDAGES/DRESSINGS) ×3 IMPLANT
GLOVE BIO SURGEON STRL SZ 6 (GLOVE) IMPLANT
GLOVE BIO SURGEON STRL SZ 6.5 (GLOVE) ×6 IMPLANT
GLOVE BIO SURGEON STRL SZ7 (GLOVE) IMPLANT
GLOVE BIO SURGEON STRL SZ8 (GLOVE) ×3 IMPLANT
GLOVE BIOGEL PI IND STRL 6 (GLOVE) IMPLANT
GLOVE BIOGEL PI IND STRL 6.5 (GLOVE) ×4 IMPLANT
GLOVE BIOGEL PI IND STRL 7.0 (GLOVE) IMPLANT
GLOVE BIOGEL PI IND STRL 8 (GLOVE) IMPLANT
GLOVE BIOGEL PI INDICATOR 6 (GLOVE)
GLOVE BIOGEL PI INDICATOR 6.5 (GLOVE) ×2
GLOVE BIOGEL PI INDICATOR 7.0 (GLOVE)
GLOVE BIOGEL PI INDICATOR 8 (GLOVE)
GLOVE ECLIPSE 8.0 STRL XLNG CF (GLOVE) ×3 IMPLANT
GOWN STRL REUS W/TWL LRG LVL3 (GOWN DISPOSABLE) ×6 IMPLANT
GOWN STRL REUS W/TWL XL LVL3 (GOWN DISPOSABLE) ×3 IMPLANT
HOLDER FOLEY CATH W/STRAP (MISCELLANEOUS) ×3 IMPLANT
I-Seed AgX100 ×234 IMPLANT
IMPL SPACEOAR SYSTEM 10ML (Spacer) ×2 IMPLANT
IMPLANT SPACEOAR SYSTEM 10ML (Spacer) ×3 IMPLANT
IV NS 1000ML (IV SOLUTION) ×1
IV NS 1000ML BAXH (IV SOLUTION) ×2 IMPLANT
IV SOD CHL 0.9% 1000ML (IV SOLUTION) ×3 IMPLANT
KIT TURNOVER CYSTO (KITS) ×3 IMPLANT
MANIFOLD NEPTUNE II (INSTRUMENTS) IMPLANT
MARKER SKIN DUAL TIP RULER LAB (MISCELLANEOUS) ×3 IMPLANT
PACK CYSTO (CUSTOM PROCEDURE TRAY) ×3 IMPLANT
SURGILUBE 2OZ TUBE FLIPTOP (MISCELLANEOUS) ×3 IMPLANT
SUT BONE WAX W31G (SUTURE) IMPLANT
SYR 10ML LL (SYRINGE) ×6 IMPLANT
TOWEL OR 17X24 6PK STRL BLUE (TOWEL DISPOSABLE) ×3 IMPLANT
UNDERPAD 30X30 (UNDERPADS AND DIAPERS) ×6 IMPLANT
WATER STERILE IRR 3000ML UROMA (IV SOLUTION) IMPLANT
WATER STERILE IRR 500ML POUR (IV SOLUTION) ×3 IMPLANT

## 2018-06-29 NOTE — Anesthesia Procedure Notes (Addendum)
Procedure Name: LMA Insertion Date/Time: 06/29/2018 12:11 PM Performed by: Wanita Chamberlain, CRNA Pre-anesthesia Checklist: Patient identified, Emergency Drugs available, Suction available, Patient being monitored and Timeout performed Patient Re-evaluated:Patient Re-evaluated prior to induction Oxygen Delivery Method: Circle system utilized Preoxygenation: Pre-oxygenation with 100% oxygen Induction Type: IV induction Ventilation: Mask ventilation without difficulty LMA: LMA inserted LMA Size: 5.0 Number of attempts: 1 Placement Confirmation: breath sounds checked- equal and bilateral,  CO2 detector and positive ETCO2 Tube secured with: Tape Dental Injury: Teeth and Oropharynx as per pre-operative assessment

## 2018-06-29 NOTE — Op Note (Signed)
PRE-OPERATIVE DIAGNOSIS:  Adenocarcinoma of the prostate  POST-OPERATIVE DIAGNOSIS:  Same  PROCEDURE:  Procedure(s): 1. I-125 radioactive seed implantation 2. Cystoscopy 3. Placement of SpaceOAR  SURGEON:  Surgeon(s): Nicolette Bang, MD  Radiation oncologist:  Tyler Pita, MD  ANESTHESIA:  General  EBL:  Minimal  DRAINS: 93 French Foley catheter  INDICATION: James Mullen is a 67 year old with a history of T1c prostate cancer. After discussing treatment options he has elected to proceed with brachytherapy  Description of procedure: After informed consent the patient was brought to the major OR, placed on the table and administered general anesthesia. He was then moved to the modified lithotomy position with his perineum perpendicular to the floor. His perineum and genitalia were then sterilely prepped. An official timeout was then performed. A 16 French Foley catheter was then placed in the bladder and filled with dilute contrast, a rectal tube was placed in the rectum and the transrectal ultrasound probe was placed in the rectum and affixed to the stand. He was then sterilely draped.  Real time ultrasonography was used along with the seed planning software [employer] Pro[employer] vs. 4.2.21. This was used to develop the seed plan including the number of needles as well as number of seeds required for complete and adequate coverage. Real-time ultrasonography was then used along with the previously developed plan and the Nucletron device to implant a total of 78 seeds using 27 needles. This proceeded without difficulty or complication.  We then proceeded to mix the SpaceOAR using the kit supplied from the manufacturer. Once this was complete we placed a sinal needle into the perirectal fat between the rectum and the prostate. Once this was accomplished we injected 2cc of normal saline to hydrodissect the plain. We then instilled the the SpaceOAR through the spinal needle and noted good  distribution in the perirectal fat.    A Foley catheter was then removed as well as the transrectal ultrasound probe and rectal probe. Flexible cystoscopy was then performed using the 17 French flexible scope which revealed a normal urethra throughout its length down to the sphincter which appeared intact. The prostatic urethra revealed bilobar hypertrophy but no evidence of obstruction, seeds, spacers or lesions. The bladder was then entered and fully and systematically inspected. The ureteral orifices were noted to be of normal configuration and position. The mucosa revealed no evidence of tumors. There were also no stones identified within the bladder. I noted no seeds or spacers on the floor of the bladder and retroflexion of the scope revealed no seeds protruding from the base of the prostate.  The cystoscope was then removed and a new 22 French Foley catheter was then inserted and the balloon was filled with 10 cc of sterile water. This was connected to closed system drainage and the patient was awakened and taken to recovery room in stable and satisfactory condition. He tolerated procedure well and there were no intraoperative complications.

## 2018-06-29 NOTE — Progress Notes (Signed)
  Radiation Oncology         [phone] ________________________________  Name: James Mullen MRN: [id number]  Date: 06/29/2018  DOB: 09/05/1950       Prostate Seed Implant  RK:YHCW, Hunt Oris, MD  No ref. provider found  DIAGNOSIS: 67 y.o. gentleman with Stage T1c adenocarcinoma of the prostate with Gleason score of 3+4, and PSA of 7.  PROCEDURE: Insertion of radioactive I-125 seeds into the prostate gland.  RADIATION DOSE: 145 Gy, definitive therapy.  TECHNIQUE: James Mullen was brought to the operating room with the urologist. He was placed in the dorsolithotomy position. He was catheterized and a rectal tube was inserted. The perineum was shaved, prepped and draped. The ultrasound probe was then introduced into the rectum to see the prostate gland.  TREATMENT DEVICE: A needle grid was attached to the ultrasound probe stand and anchor needles were placed.  3D PLANNING: The prostate was imaged in 3D using a sagittal sweep of the prostate probe. These images were transferred to the planning computer. There, the prostate, urethra and rectum were defined on each axial reconstructed image. Then, the software created an optimized 3D plan and a few seed positions were adjusted. The quality of the plan was reviewed using [facility] information for the target and the following two organs at risk:  Urethra and Rectum.  Then the accepted plan was printed and handed off to the radiation therapist.  Under my supervision, the custom loading of the seeds and spacers was carried out and loaded into sealed vicryl sleeves.  These pre-loaded needles were then placed into the needle holder.Marland Kitchen  PROSTATE VOLUME STUDY:  Using transrectal ultrasound the volume of the prostate was verified to be 49.6 cc.  SPECIAL TREATMENT PROCEDURE/SUPERVISION AND HANDLING: The pre-loaded needles were then delivered under sagittal guidance. A total of 27 needles were used to deposit 78 seeds in the prostate gland. The individual  seed activity was 0.467 mCi.  SpaceOAR:  Yes  COMPLEX SIMULATION: At the end of the procedure, an anterior radiograph of the pelvis was obtained to document seed positioning and count. Cystoscopy was performed to check the urethra and bladder.  MICRODOSIMETRY: At the end of the procedure, the patient was emitting 0.074 mR/hr at 1 meter. Accordingly, he was considered safe for hospital discharge.  PLAN: The patient will return to the radiation oncology clinic for post implant CT dosimetry in three weeks.   ________________________________  Sheral Apley Tammi Klippel, M.D.

## 2018-06-29 NOTE — Anesthesia Preprocedure Evaluation (Addendum)
Anesthesia Evaluation  Patient identified by MRN, date of birth, ID band Patient awake    Reviewed: Allergy & Precautions, NPO status , Patient's Chart, lab work & pertinent test results, reviewed documented beta blocker date and time   History of Anesthesia Complications Negative for: history of anesthetic complications  Airway Mallampati: III  TM Distance: >3 FB Neck ROM: Full    Dental  (+) Teeth Intact, Dental Advisory Given   Pulmonary sleep apnea ,    Pulmonary exam normal breath sounds clear to auscultation       Cardiovascular hypertension, Pt. on medications and Pt. on home beta blockers + Peripheral Vascular Disease  Normal cardiovascular exam Rhythm:Regular Rate:Normal     Neuro/Psych Anxiety Depression negative neurological ROS     GI/Hepatic Neg liver ROS, GERD  Controlled,  Endo/Other  negative endocrine ROS  Renal/GU negative Renal ROS     Musculoskeletal  (+) Arthritis , Osteoarthritis,    Abdominal   Peds  Hematology negative hematology ROS (+)   Anesthesia Other Findings Day of surgery medications reviewed with the patient.  Reproductive/Obstetrics                            Anesthesia Physical Anesthesia Plan  ASA: II  Anesthesia Plan: General   Post-op Pain Management:    Induction: Intravenous  PONV Risk Score and Plan: 2 and Treatment may vary due to age or medical condition, Ondansetron, Dexamethasone and Midazolam  Airway Management Planned: LMA  Additional Equipment:   Intra-op Plan:   Post-operative Plan: Extubation in OR  Informed Consent: I have reviewed the patients History and Physical, chart, labs and discussed the procedure including the risks, benefits and alternatives for the proposed anesthesia with the patient or authorized representative who has indicated his/her understanding and acceptance.   Dental advisory given  Plan Discussed  with: CRNA  Anesthesia Plan Comments:        Anesthesia Quick Evaluation

## 2018-06-29 NOTE — Discharge Instructions (Signed)
Indwelling Urinary Catheter Care, Adult An indwelling urinary catheter is a thin tube that is put into your bladder. The tube helps to drain pee (urine) out of your body. The tube goes in through your urethra. Your urethra is where pee comes out of your body. Your pee will come out through the catheter, then it will go into a bag (drainage bag). Take good care of your catheter so it will work well. How to wear your catheter and bag Supplies needed  Sticky tape (adhesive tape) or a leg strap.  Alcohol wipe or soap and water (if you use tape).  A clean towel (if you use tape).  Large overnight bag.  Smaller bag (leg bag). Wearing your catheter Attach your catheter to your leg with tape or a leg strap.  Make sure the catheter is not pulled tight.  If a leg strap gets wet, take it off and put on a dry strap.  If you use tape to hold the bag on your leg: 1. Use an alcohol wipe or soap and water to wash your skin where the tape made it sticky before. 2. Use a clean towel to pat-dry that skin. 3. Use new tape to make the bag stay on your leg. Wearing your bags You should have been given a large overnight bag.  You may wear the overnight bag in the day or night.  Always have the overnight bag lower than your bladder.  Do not let the bag touch the floor.  Before you go to sleep, put a clean plastic bag in a wastebasket. Then hang the overnight bag inside the wastebasket. You should also have a smaller leg bag that fits under your clothes.  Always wear the leg bag below your knee.  Do not wear your leg bag at night. How to care for your skin and catheter Supplies needed  A clean washcloth.  Water and mild soap.  A clean towel. Caring for your skin and catheter      Clean the skin around your catheter every day: ? Wash your hands with soap and water. ? Wet a clean washcloth in warm water and mild soap. ? Clean the skin around your urethra. ? If you are male: ? Gently  spread the folds of skin around your vagina (labia). ? With the washcloth in your other hand, wipe the inner side of your labia on each side. Wipe from front to back. ? If you are male: ? Pull back any skin that covers the end of your penis (foreskin). ? With the washcloth in your other hand, wipe your penis in small circles. Start wiping at the tip of your penis, then move away from the catheter. ? With your free hand, hold the catheter close to where it goes into your body. ? Keep holding the catheter during cleaning so it does not get pulled out. ? With the washcloth in your other hand, clean the catheter. ? Only wipe downward on the catheter. ? Do not wipe upward toward your body. Doing this may push germs into your urethra and cause infection. ? Use a clean towel to pat-dry the catheter and the skin around it. Make sure to wipe off all soap. ? Wash your hands with soap and water.  Shower every day. Do not take baths.  Do not use cream, ointment, or lotion on the area where the catheter goes into your body, unless your doctor tells you to.  Do not use powders, sprays, or lotions  on your genital area.  Check your skin around the catheter every day for signs of infection. Check for: ? Redness, swelling, or pain. ? Fluid or blood. ? Warmth. ? Pus or a bad smell. How to empty the bag Supplies needed  Rubbing alcohol.  Gauze pad or cotton ball.  Tape or a leg strap. Emptying the bag Pour the pee out of your bag when it is ?- full, or at least 2-3 times a day. Do this for your overnight bag and your leg bag. 1. Wash your hands with soap and water. 2. Separate (detach) the bag from your leg. 3. Hold the bag over the toilet or a clean pail. Keep the bag lower than your hips and bladder. This is so the pee (urine) does not go back into the tube. 4. Open the pour spout. It is at the bottom of the bag. 5. Empty the pee into the toilet or pail. Do not let the pour spout touch any  surface. 6. Put rubbing alcohol on a gauze pad or cotton ball. 7. Use the gauze pad or cotton ball to clean the pour spout. 8. Close the pour spout. 9. Attach the bag to your leg with tape or a leg strap. 10. Wash your hands with soap and water. Follow instructions for cleaning the drainage bag:  From the product maker.  As told by your doctor. How to change the bag Supplies needed  Alcohol wipes.  A clean bag.  Tape or a leg strap. Changing the bag Replace your bag with a clean bag once a month. If it starts to leak, smell bad, or look dirty, change it sooner. 1. Wash your hands with soap and water. 2. Separate the dirty bag from your leg. 3. Pinch the catheter with your fingers so that pee does not spill out. 4. Separate the catheter tube from the bag tube where these tubes connect (at the connection valve). Do not let the tubes touch any surface. 5. Clean the end of the catheter tube with an alcohol wipe. Use a different alcohol wipe to clean the end of the bag tube. 6. Connect the catheter tube to the tube of the clean bag. 7. Attach the clean bag to your leg with tape or a leg strap. Do not make the bag tight on your leg. 8. Wash your hands with soap and water. General rules   Never pull on your catheter. Never try to take it out. Doing that can hurt you.  Always wash your hands before and after you touch your catheter or bag. Use a mild, fragrance-free soap. If you do not have soap and water, use hand sanitizer.  Always make sure there are no twists or bends (kinks) in the catheter tube.  Always make sure there are no leaks in the catheter or bag.  Drink enough fluid to keep your pee pale yellow.  Do not take baths, swim, or use a hot tub.  If you are male, wipe from front to back after you poop (have a bowel movement). Contact a doctor if:  Your pee is cloudy.  Your pee smells worse than usual.  Your catheter gets clogged.  Your catheter leaks.  Your  bladder feels full. Get help right away if:  You have redness, swelling, or pain where the catheter goes into your body.  You have fluid, blood, pus, or a bad smell coming from the area where the catheter goes into your body.  Your skin feels warm  where the catheter goes into your body.  You have a fever.  You have pain in your: ? Belly (abdomen). ? Legs. ? Lower back. ? Bladder.  You see blood in the catheter.  Your pee is pink or red.  You feel sick to your stomach (nauseous).  You throw up (vomit).  You have chills.  Your pee is not draining into the bag.  Your catheter gets pulled out. Summary  An indwelling urinary catheter is a thin tube that is placed into the bladder to help drain pee (urine) out of the body.  The catheter is placed into the part of the body that drains pee from the bladder (urethra).  Taking good care of your catheter will keep it working properly and help prevent problems.  Always wash your hands before and after touching your catheter or bag.  Never pull on your catheter or try to take it out. This information is not intended to replace advice given to you by your health care provider. Make sure you discuss any questions you have with your health care provider. Document Released: 10/12/2012 Document Revised: 12/08/2017 Document Reviewed: 01/31/2017 Elsevier Interactive Patient Education  2019 [place] Instructions   Activity:    Rest for the remainder of the day.  Do not drive or operate equipment today.  You may resume normal  activities in a few days as instructed by your physician, without risk of harmful radiation exposure to those around you, provided you follow the time and distance precautions on the Radiation Oncology Instruction Sheet.   Meals: Drink plenty of lipuids and eat light foods, such as gelatin or soup this evening .  You may return to normal meal plan tomorrow.  Return To  Work: You may return to work as instructed by [employer].  Special Instruction:   If any seeds are found, use tweezers to pick up seeds and place in a glass container of any kind and bring to your physician's office.  Call your physician if any of these symptoms occur:   Persistent or heavy bleeding  Urine stream diminishes or stops completely after catheter is removed  Fever equal to or greater than 101 degrees F  Cloudy urine with a strong foul odor  Severe pain  You may feel some burning pain and/or hesitancy when you urinate after the catheter is removed.  These symptoms may increase over the next few weeks, but should diminish within forur to six weeks.  Applying moist heat to the lower abdomen or a hot tub bath may help relieve the pain.  If the discomfort becomes severe, please call your physician for additional medications.   Post Anesthesia Home Care Instructions  Activity: Get plenty of rest for the remainder of the day. A responsible individual must stay with you for 24 hours following the procedure.  For the next 24 hours, DO NOT: -Drive a car -Paediatric nurse -Drink alcoholic beverages -Take any medication unless instructed by your physician -Make any legal decisions or sign important papers.  Meals: Start with liquid foods such as gelatin or soup. Progress to regular foods as tolerated. Avoid greasy, spicy, heavy foods. If nausea and/or vomiting occur, drink only clear liquids until the nausea and/or vomiting subsides. Call your physician if vomiting continues.  Special Instructions/Symptoms: Your throat may feel dry or sore from the anesthesia or the breathing tube placed in your throat during surgery. If this causes discomfort, gargle with warm salt water. The discomfort  should disappear within 24 hours.  If you had a scopolamine patch placed behind your ear for the management of post- operative nausea and/or vomiting:  1. The medication in the patch is  effective for 72 hours, after which it should be removed.  Wrap patch in a tissue and discard in the trash. Wash hands thoroughly with soap and water. 2. You may remove the patch earlier than 72 hours if you experience unpleasant side effects which may include dry mouth, dizziness or visual disturbances. 3. Avoid touching the patch. Wash your hands with soap and water after contact with the patch.

## 2018-06-29 NOTE — Transfer of Care (Signed)
Immediate Anesthesia Transfer of Care Note  Patient: James Mullen  Procedure(s) Performed: RADIOACTIVE SEED IMPLANT/BRACHYTHERAPY IMPLANT (N/A ) SPACE OAR INSTILLATION (N/A )  Patient Location: PACU  Anesthesia Type:General  Level of Consciousness: awake, alert , oriented and patient cooperative  Airway & Oxygen Therapy: Patient Spontanous Breathing and Patient connected to nasal cannula oxygen  Post-op Assessment: Report given to RN and Post -op Vital signs reviewed and stable  Post vital signs: Reviewed and stable  Last Vitals:  Vitals Value Taken Time  BP 126/92 06/29/2018  1:23 PM  Temp    Pulse 71 06/29/2018  1:24 PM  Resp 11 06/29/2018  1:24 PM  SpO2 98 % 06/29/2018  1:24 PM  Vitals shown include unvalidated device data.  Last Pain:  Vitals:   06/29/18 0852  TempSrc: Oral         Complications: No apparent anesthesia complications

## 2018-06-29 NOTE — H&P (Signed)
Urology Admission H&P  Chief Complaint: Prostate Cancer  History of Present Illness: James Mullen is a 67yo with a hx of T1c prostate cancer here for brachytherapy and SpaceOAR. No significant LUTS. No fevers/chills/sweats. No nausea/vomiting  Past Medical History:  Diagnosis Date  . ANEMIA-NOS 07/08/2008  . ANXIETY 07/08/2008   Patient denies.  Marland Kitchen BENIGN PROSTATIC HYPERTROPHY 07/08/2008  . GERD 07/08/2008  . HYPERTENSION 07/08/2008  . INSOMNIA-SLEEP DISORDER-UNSPEC 07/08/2008  . 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 PERIPHERAL VASCULAR DISEASE 07/08/2008  . Prostate cancer ([place])   . PSA, INCREASED 07/08/2008  . Sleep apnea    ok if patient sleeps on his side. does not use CPAP   Past Surgical History:  Procedure Laterality Date  . COLONOSCOPY  multiple, last 03/08/2011   up until 2012: no adenomas 2012: 2 diminutive polyps  . ILIAC ARTERY ANEURYSM REPAIR  2007  . POLYPECTOMY    . [place]  . TONSILLECTOMY    . UVULOPALATOPHARYNGOPLASTY  1988    Home Medications:  Current Facility-Administered Medications  Medication Dose Route Frequency Provider Last Rate Last Dose  . ceFAZolin (ANCEF) IVPB 2g/100 mL premix  2 g Intravenous Once Alyson Ingles Candee Furbish, MD      . lactated ringers infusion   Intravenous Continuous Duane Boston, MD 50 mL/hr at 06/29/18 [phone]    . [START ON 06/30/2018] sodium phosphate (FLEET) 7-19 GM/118ML enema 1 enema  1 enema Rectal Once Corazon Nickolas, Candee Furbish, MD       Allergies:  Allergies  Allergen Reactions  . Penicillins Hives    Family History  Problem Relation Age of Onset  . Colon cancer Father 3  . Breast cancer Mother   . Esophageal cancer Neg Hx   . Rectal cancer Neg Hx   . Stomach cancer Neg Hx   . Pancreatic cancer Neg Hx   . Prostate cancer Neg Hx    Social History:  reports that he has never smoked. He has never used smokeless tobacco. He reports current alcohol use of about 2.0 standard drinks of alcohol per week. He reports  that he does not use drugs.  Review of Systems  All other systems reviewed and are negative.   Physical Exam:  Vital signs in last 24 hours: Temp:  [97.6 F (36.4 C)] 97.6 F (36.4 C) (12/30 0852) Pulse Rate:  [76] 76 (12/30 0852) Resp:  [16] 16 (12/30 0852) BP: (114)/(84) 114/84 (12/30 0852) SpO2:  [99 %] 99 % (12/30 0852) Weight:  [85.8 kg] 85.8 kg (12/30 1308) Physical Exam  Constitutional: He is oriented to person, place, and time. He appears well-developed and well-nourished.  HENT:  Head: Normocephalic and atraumatic.  Eyes: Pupils are equal, round, and reactive to light. EOM are normal.  Neck: Normal range of motion. No thyromegaly present.  Cardiovascular: Normal rate and regular rhythm.  Respiratory: Effort normal. No respiratory distress.  GI: Soft. He exhibits no distension.  Musculoskeletal: Normal range of motion.        General: No edema.  Neurological: He is alert and oriented to person, place, and time.  Skin: Skin is warm and dry.  Psychiatric: He has a normal mood and affect. His behavior is normal. Judgment and thought content normal.    Laboratory Data:  No results found for this or any previous visit (from the past 24 hour(s)). No results found for this or any previous visit (from the past 240 hour(s)). Creatinine: No results for input(s): CREATININE in the  last 168 hours. Baseline Creatinine: unknown  Impression/Assessment:  67yo with prostate cancer  Plan:  The risks/benefits/alternatives to brachytherapy with SpaceOAR placement was explained to the patient and he understands and wishes to proceed with surgery  Nicolette Bang 06/29/2018, 11:20 AM

## 2018-06-29 NOTE — Anesthesia Postprocedure Evaluation (Signed)
Anesthesia Post Note  Patient: James Mullen  Procedure(s) Performed: RADIOACTIVE SEED IMPLANT/BRACHYTHERAPY IMPLANT (N/A Prostate) SPACE OAR INSTILLATION (N/A Rectum)     Patient location during evaluation: PACU Anesthesia Type: General Level of consciousness: awake and alert Pain management: pain level controlled Vital Signs Assessment: post-procedure vital signs reviewed and stable Respiratory status: spontaneous breathing, nonlabored ventilation and respiratory function stable Cardiovascular status: blood pressure returned to baseline and stable Postop Assessment: no apparent nausea or vomiting Anesthetic complications: no    Last Vitals:  Vitals:   06/29/18 1345 06/29/18 1400  BP: 124/82 115/71  Pulse: 63 (!) 58  Resp: 10 11  Temp:    SpO2: 100% 100%    Last Pain:  Vitals:   06/29/18 1345  TempSrc:   PainSc: [place]

## 2018-06-30 ENCOUNTER — Encounter (HOSPITAL_BASED_OUTPATIENT_CLINIC_OR_DEPARTMENT_OTHER): Payer: Self-pay | Admitting: Urology

## 2018-07-03 DIAGNOSIS — C61 Malignant neoplasm of prostate: Secondary | ICD-10-CM | POA: Diagnosis not present

## 2018-07-14 ENCOUNTER — Telehealth: Payer: Self-pay | Admitting: *Deleted

## 2018-07-14 DIAGNOSIS — C61 Malignant neoplasm of prostate: Secondary | ICD-10-CM | POA: Diagnosis not present

## 2018-07-14 NOTE — Telephone Encounter (Signed)
Called to remind of post seed appts. and MRI for 07-15-18, lvm for a return call

## 2018-07-15 ENCOUNTER — Other Ambulatory Visit: Payer: Self-pay

## 2018-07-15 ENCOUNTER — Ambulatory Visit
Admission: RE | Admit: 2018-07-15 | Discharge: 2018-07-15 | Disposition: A | Discharge status: Home / Self Care | Payer: Medicare HMO | Source: Ambulatory Visit | Attending: Radiation Oncology | Admitting: Radiation Oncology

## 2018-07-15 ENCOUNTER — Ambulatory Visit
Admission: RE | Admit: 2018-07-15 | Discharge: 2018-07-15 | Disposition: A | Discharge status: Home / Self Care | Payer: Medicare HMO | Source: Ambulatory Visit | Attending: Urology | Admitting: Urology

## 2018-07-15 ENCOUNTER — Ambulatory Visit (HOSPITAL_COMMUNITY)
Admission: RE | Admit: 2018-07-15 | Discharge: 2018-07-15 | Disposition: A | Discharge status: Home / Self Care | Payer: Medicare HMO | Source: Ambulatory Visit | Attending: Urology | Admitting: Urology

## 2018-07-15 ENCOUNTER — Encounter: Payer: Self-pay | Admitting: Urology

## 2018-07-15 VITALS — BP 99/71 | HR 85 | Temp 98.7°F | Resp 20 | Ht 74.0 in | Wt 186.2 lb

## 2018-07-15 DIAGNOSIS — Z79899 Other long term (current) drug therapy: Secondary | ICD-10-CM | POA: Diagnosis not present

## 2018-07-15 DIAGNOSIS — C61 Malignant neoplasm of prostate: Secondary | ICD-10-CM | POA: Insufficient documentation

## 2018-07-15 DIAGNOSIS — R350 Frequency of micturition: Secondary | ICD-10-CM | POA: Insufficient documentation

## 2018-07-15 DIAGNOSIS — R3911 Hesitancy of micturition: Secondary | ICD-10-CM | POA: Diagnosis not present

## 2018-07-15 NOTE — Progress Notes (Signed)
Returns for post seed follow up. Pre seed IPSS 13. Post seed IPSS 25. Reports discomfort with urination but denies a burning sensation. Denies hematuria in the last 2-3 days. Denies urinary leakage or incontinence. Denies diarrhea. Evaluated by urologist yesterday and told he does not have urinary retention. Scheduled for MRI to confirm SpaceOar placement today at 1600.   BP 99/71 (BP Location: Right Arm, Patient Position: Sitting)   Pulse 85   Temp 98.7 F (37.1 C) (Oral)   Resp 20   Ht 6\' 2"  (1.88 m)   Wt 186 lb 3.2 oz (84.5 kg)   SpO2 98%   BMI 23.91 kg/m

## 2018-07-15 NOTE — Progress Notes (Signed)
Radiation Oncology         [phone] ________________________________  Name: TAISEI BONNETTE MRN: [id number]  Date: 07/15/2018  DOB: 10/19/1950  Post-Seed Follow-Up Visit Note  CC: Biagio Borg, MD  Biagio Borg, MD  Diagnosis:   68 y.o. gentleman with Stage T1c adenocarcinoma of the prostate with Gleason score of 3+4, and PSA of 7.    ICD-10-CM   1. Malignant neoplasm of prostate (HCC) C61     Interval Since Last Radiation:  2 weeks 06/29/18:  Insertion of radioactive I-125 seeds into the prostate gland; 145 Gy, definitive therapy.  Narrative:  The patient returns today for routine follow-up.  He is complaining of increased urinary frequency and urinary hesitation symptoms. He filled out a questionnaire regarding urinary function today providing and overall IPSS score of 25 characterizing his symptoms as severe with nocturia 2-4/night, weak steam, hesitancy, intermittency, urgency and daytime frequency.  He denies dysuria, gross hematuria or incontinence  His pre-implant score was 13. He reports a healthy appetite and is maintaining his weight.  He denies abdominal pain, nausea, vomiting, diarrhea or constipation.  He has continued with occasional bouts of soft stools but denies outright diarrhea.  Overall, he is pleased with his current progress.  ALLERGIES:  is allergic to penicillins.  Meds: Current Outpatient Medications  Medication Sig Dispense Refill  . atenolol (TENORMIN) 50 MG tablet TAKE 1 TABLET BY MOUTH EVERY DAY 90 tablet 3  . Calcium Carbonate-Vitamin D (CALCIUM PLUS VITAMIN D PO) Take by mouth daily.    . Coenzyme Q10 (COQ10 PO) Take by mouth daily.    . Fish Oil OIL by Does not apply route daily.    . Glucosamine-Chondroitin-MSM 500-200-150 MG TABS Take 1 tablet by mouth daily.    . Nutritional Supplements (DHEA PO) Take by mouth daily.    . traMADol (ULTRAM) 50 MG tablet Take 1 tablet (50 mg total) by mouth every 6 (six) hours as needed. 30 tablet 0   No  current facility-administered medications for this encounter.     Physical Findings: In general this is a well appearing Caucasian male in no acute distress.  He's alert and oriented x4 and appropriate throughout the examination. Cardiopulmonary assessment is negative for acute distress and he exhibits normal effort.   Lab Findings: Lab Results  Component Value Date   WBC 5.1 06/22/2018   HGB 15.1 06/22/2018   HCT 46.3 06/22/2018   MCV 84.6 06/22/2018   PLT 149 (L) 06/22/2018    Radiographic Findings:  Patient underwent CT imaging in our clinic for post implant dosimetry. The CT was reviewed by Dr. Tammi Klippel and appears to demonstrate an adequate distribution of radioactive seeds throughout the prostate gland. There are no seeds in or near the rectum.  He will have an MRI of the prostate following our visit today and these images will be fused with his CT images for further evaluation.  We suspect the final radiation plan and dosimetry will show appropriate coverage of the prostate gland.   Impression/Plan: The patient is recovering from the effects of radiation. His urinary symptoms should gradually improve over the next 4-6 months. We talked about this today. He is encouraged by his improvement already and is otherwise pleased with his outcome. We also talked about long-term follow-up for prostate cancer following seed implant. He understands that ongoing PSA determinations and digital rectal exams will help perform surveillance to rule out disease recurrence. He was recently evaluated at [facility] urology on 07/14/2018  and had a PVR which confirmed that he is indeed adequately emptying his bladder on voiding.  He was prescribed Flomax which he plans to begin this evening.  He is scheduled for a follow up appointment with Dr. Alyson Ingles in April 2020. He understands what to expect with his PSA measures. Patient was also educated today about some of the long-term effects from radiation including a  small risk for rectal bleeding and possibly erectile dysfunction. We talked about some of the general management approaches to these potential complications. However, I did encourage the patient to contact our office or return at any point if he has questions or concerns related to his previous radiation and prostate cancer.    Nicholos Johns, PA-C

## 2018-07-17 NOTE — Progress Notes (Signed)
  Radiation Oncology         [phone] ________________________________  Name: James Mullen MRN: [id number]  Date: 07/15/2018  DOB: 02-May-1951  COMPLEX SIMULATION NOTE  NARRATIVE:  The patient was brought to the [place] suite today following prostate seed implantation approximately one month ago.  Identity was confirmed.  All relevant records and images related to the planned course of therapy were reviewed.  Then, the patient was set-up supine.  CT images were obtained.  The CT images were loaded into the planning software.  Then the prostate and rectum were contoured.  Treatment planning then occurred.  The implanted iodine 125 seeds were identified by the physics staff for projection of radiation distribution  I have requested : 3D Simulation  I have requested a DVH of the following structures: Prostate and rectum.    ________________________________  Sheral Apley Tammi Klippel, M.D.  This document serves as a record of services personally performed by Tyler Pita, MD. It was created on his behalf by Wilburn Mylar, a trained medical scribe. The creation of this record is based on the scribe's personal observations and the provider's statements to them. This document has been checked and approved by the attending provider.

## 2018-08-06 ENCOUNTER — Encounter: Payer: Self-pay | Admitting: Radiation Oncology

## 2018-08-06 DIAGNOSIS — C61 Malignant neoplasm of prostate: Secondary | ICD-10-CM | POA: Diagnosis not present

## 2018-08-25 NOTE — Progress Notes (Signed)
  Radiation Oncology         [phone] ________________________________  Name: James Mullen MRN: [id number]  Date: 08/06/2018  DOB: Feb 10, 1951  3D Planning Note   Prostate Brachytherapy Post-Implant Dosimetry  Diagnosis: 68 y.o. 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 He underwent CT scan complex simulation to delineate the three-dimensional structures of the pelvis and demonstrate the radiation distribution.  Since that time, the seed localization, and complex isodose planning with dose volume histograms have now been completed.  Results:   Prostate Coverage - The dose of radiation delivered to the 90% or more of the prostate gland (D90) was 108.94% of the prescription dose. This exceeds our goal of greater than 90%. Rectal Sparing - The volume of rectal tissue receiving the prescription dose or higher was 0.0 cc. This falls under our thresholds tolerance of 1.0 cc.  Impression: The prostate seed implant appears to show adequate target coverage and appropriate rectal sparing.  Plan:  The patient will continue to follow with urology for ongoing PSA determinations. I would anticipate a high likelihood for local tumor control with minimal risk for rectal morbidity.  ________________________________  Sheral Apley Tammi Klippel, M.D.

## 2018-10-01 ENCOUNTER — Ambulatory Visit: Payer: PRIVATE HEALTH INSURANCE | Admitting: Vascular Surgery

## 2018-10-06 DIAGNOSIS — C61 Malignant neoplasm of prostate: Secondary | ICD-10-CM | POA: Diagnosis not present

## 2018-10-13 DIAGNOSIS — C61 Malignant neoplasm of prostate: Secondary | ICD-10-CM | POA: Diagnosis not present

## 2018-10-13 DIAGNOSIS — R3912 Poor urinary stream: Secondary | ICD-10-CM | POA: Diagnosis not present

## 2018-11-04 DIAGNOSIS — Z791 Long term (current) use of non-steroidal anti-inflammatories (NSAID): Secondary | ICD-10-CM | POA: Diagnosis not present

## 2018-11-04 DIAGNOSIS — I10 Essential (primary) hypertension: Secondary | ICD-10-CM | POA: Diagnosis not present

## 2018-11-04 DIAGNOSIS — K219 Gastro-esophageal reflux disease without esophagitis: Secondary | ICD-10-CM | POA: Diagnosis not present

## 2018-11-04 DIAGNOSIS — G8929 Other chronic pain: Secondary | ICD-10-CM | POA: Diagnosis not present

## 2018-11-04 DIAGNOSIS — C61 Malignant neoplasm of prostate: Secondary | ICD-10-CM | POA: Diagnosis not present

## 2018-11-04 DIAGNOSIS — M1990 Unspecified osteoarthritis, unspecified site: Secondary | ICD-10-CM | POA: Diagnosis not present

## 2018-11-04 DIAGNOSIS — Z809 Family history of malignant neoplasm, unspecified: Secondary | ICD-10-CM | POA: Diagnosis not present

## 2018-11-04 DIAGNOSIS — Z880 Allergy status to penicillin: Secondary | ICD-10-CM | POA: Diagnosis not present

## 2018-11-27 ENCOUNTER — Encounter: Payer: Self-pay | Admitting: Internal Medicine

## 2018-11-27 ENCOUNTER — Ambulatory Visit (INDEPENDENT_AMBULATORY_CARE_PROVIDER_SITE_OTHER): Payer: Medicare HMO | Admitting: Internal Medicine

## 2018-11-27 DIAGNOSIS — Z0000 Encounter for general adult medical examination without abnormal findings: Secondary | ICD-10-CM

## 2018-11-27 DIAGNOSIS — E559 Vitamin D deficiency, unspecified: Secondary | ICD-10-CM | POA: Diagnosis not present

## 2018-11-27 DIAGNOSIS — E611 Iron deficiency: Secondary | ICD-10-CM

## 2018-11-27 DIAGNOSIS — E538 Deficiency of other specified B group vitamins: Secondary | ICD-10-CM

## 2018-11-27 DIAGNOSIS — Z0001 Encounter for general adult medical examination with abnormal findings: Secondary | ICD-10-CM

## 2018-11-27 NOTE — Progress Notes (Signed)
Patient ID: James Mullen, male   DOB: April 04, 1951, 68 y.o.   MRN: [id number]  Virtual Visit via Video Note  I connected with Colin Ina on 11/27/18 at  9:00 AM EDT by a video enabled telemedicine application and verified that I am speaking with the correct person using two identifiers.  Location: Patient: at home Provider: at office   I discussed the limitations of evaluation and management by telemedicine and the availability of in person appointments. The patient expressed understanding and agreed to proceed.  History of Present Illness: Here for wellness and f/u;  Overall doing ok;  Pt denies Chest pain, worsening SOB, DOE, wheezing, orthopnea, PND, worsening LE edema, palpitations, dizziness or syncope.  Pt denies neurological change such as new headache, facial or extremity weakness.  Pt denies polydipsia, polyuria, or low sugar symptoms. Pt states overall good compliance with treatment and medications, good tolerability, and has been trying to follow appropriate diet.  Pt denies worsening depressive symptoms, suicidal ideation or panic. No fever, night sweats, wt loss, loss of appetite, or other constitutional symptoms.  Pt states good ability with ADL's, has low fall risk, home safety reviewed and adequate, no other significant changes in hearing or vision, and only occasionally active with exercise.   Also, Denies urinary symptoms such as dysuria, frequency, urgency, flank pain, hematuria or n/v, fever, chills, see urology about every 6 mo.  Has nocturia 2 times per night.  BP at [place] has been about 110/70 Past Medical History:  Diagnosis Date  . ANEMIA-NOS 07/08/2008  . ANXIETY 07/08/2008   Patient denies.  Marland Kitchen BENIGN PROSTATIC HYPERTROPHY 07/08/2008  . GERD 07/08/2008  . HYPERTENSION 07/08/2008  . INSOMNIA-SLEEP DISORDER-UNSPEC 07/08/2008  . 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 PERIPHERAL VASCULAR DISEASE 07/08/2008  . Prostate cancer ([place])   . PSA, INCREASED 07/08/2008  . Sleep apnea    ok if  patient sleeps on his side. does not use CPAP   Past Surgical History:  Procedure Laterality Date  . COLONOSCOPY  multiple, last 03/08/2011   up until 2012: no adenomas 2012: 2 diminutive polyps  . ILIAC ARTERY ANEURYSM REPAIR  2007  . POLYPECTOMY    . RADIOACTIVE SEED IMPLANT N/A 06/29/2018   Procedure: RADIOACTIVE SEED IMPLANT/BRACHYTHERAPY IMPLANT;  Surgeon: Cleon Gustin, MD;  Location: [facility];  Service: Urology;  Laterality: N/A;  2 HRS  . SPACE OAR INSTILLATION N/A 06/29/2018   Procedure: SPACE OAR INSTILLATION;  Surgeon: Cleon Gustin, MD;  Location: [facility];  Service: Urology;  Laterality: N/A;  . THORACOABDOMINAL AORTIC ANEURYSM REPAIR  1997  . TONSILLECTOMY    . UVULOPALATOPHARYNGOPLASTY  1988    reports that he has never smoked. He has never used smokeless tobacco. He reports current alcohol use of about 2.0 standard drinks of alcohol per week. He reports that he does not use drugs. family history includes Breast cancer in his mother; Colon cancer (age of onset: 67) in his father. Allergies  Allergen Reactions  . Penicillins Hives   Current Outpatient Medications on File Prior to Visit  Medication Sig Dispense Refill  . atenolol (TENORMIN) 50 MG tablet TAKE 1 TABLET BY MOUTH EVERY DAY 90 tablet 3  . Calcium Carbonate-Vitamin D (CALCIUM PLUS VITAMIN D PO) Take by mouth daily.    . Coenzyme Q10 (COQ10 PO) Take by mouth daily.    . Fish Oil OIL by Does not apply route daily.    . Glucosamine-Chondroitin-MSM 500-200-150 MG TABS Take  1 tablet by mouth daily.    . Nutritional Supplements (DHEA PO) Take by mouth daily.    . traMADol (ULTRAM) 50 MG tablet Take 1 tablet (50 mg total) by mouth every 6 (six) hours as needed. 30 tablet 0   No current facility-administered medications on file prior to visit.     Observations/Objective: Alert, NAD, appropriate mood and affect, resps normal, cn 2-12 intact, moves all 4s, no visible rash  or swelling Lab Results  Component Value Date   WBC 5.1 06/22/2018   HGB 15.1 06/22/2018   HCT 46.3 06/22/2018   PLT 149 (L) 06/22/2018   GLUCOSE 83 06/22/2018   CHOL 145 05/27/2018   TRIG 126.0 05/27/2018   HDL 43.70 05/27/2018   LDLDIRECT 98.0 11/27/2016   LDLCALC 76 05/27/2018   ALT 16 06/22/2018   AST 18 06/22/2018   NA 142 06/22/2018   K 4.2 06/22/2018   CL 106 06/22/2018   CREATININE 0.85 06/22/2018   BUN 16 06/22/2018   CO2 28 06/22/2018   TSH 1.65 05/27/2018   PSA 7.03 (H) 01/21/2018   INR 0.97 06/22/2018   Assessment and Plan: See notes  Follow Up Instructions: See notes   I discussed the assessment and treatment plan with the patient. The patient was provided an opportunity to ask questions and all were answered. The patient agreed with the plan and demonstrated an understanding of the instructions.   The patient was advised to call back or seek an in-person evaluation if the symptoms worsen or if the condition fails to improve as anticipated.   Cathlean Cower, MD

## 2018-11-27 NOTE — Patient Instructions (Signed)
Please continue all other medications as before, and refills have been done if requested.  Please have the pharmacy call with any other refills you may need.  Please continue your efforts at being more active, low cholesterol diet, and weight control.  You are otherwise up to date with prevention measures today.  Please keep your appointments with your specialists as you may have planned  Please return in 6 months, or sooner if needed, with Lab testing done 3-5 days before  

## 2018-11-28 ENCOUNTER — Encounter: Payer: Self-pay | Admitting: Internal Medicine

## 2018-11-28 NOTE — Assessment & Plan Note (Addendum)

## 2018-12-16 ENCOUNTER — Other Ambulatory Visit: Payer: Self-pay

## 2018-12-16 DIAGNOSIS — I716 Thoracoabdominal aortic aneurysm, without rupture, unspecified: Secondary | ICD-10-CM

## 2019-01-08 ENCOUNTER — Other Ambulatory Visit: Payer: Self-pay

## 2019-01-08 ENCOUNTER — Ambulatory Visit
Admission: RE | Admit: 2019-01-08 | Discharge: 2019-01-08 | Disposition: A | Discharge status: Home / Self Care | Payer: Medicare HMO | Source: Ambulatory Visit | Attending: Vascular Surgery | Admitting: Vascular Surgery

## 2019-01-08 DIAGNOSIS — I714 Abdominal aortic aneurysm, without rupture: Secondary | ICD-10-CM | POA: Diagnosis not present

## 2019-01-08 DIAGNOSIS — I716 Thoracoabdominal aortic aneurysm, without rupture, unspecified: Secondary | ICD-10-CM

## 2019-01-08 MED ORDER — IOPAMIDOL (ISOVUE-370) INJECTION 76%
75.0000 mL | Freq: Once | INTRAVENOUS | Status: AC | PRN
  Administered 2019-01-08: 75 mL via INTRAVENOUS

## 2019-01-14 ENCOUNTER — Encounter: Payer: Self-pay | Admitting: Vascular Surgery

## 2019-01-14 ENCOUNTER — Other Ambulatory Visit: Payer: Self-pay

## 2019-01-14 ENCOUNTER — Ambulatory Visit (INDEPENDENT_AMBULATORY_CARE_PROVIDER_SITE_OTHER): Payer: Medicare HMO | Admitting: Vascular Surgery

## 2019-01-14 DIAGNOSIS — I714 Abdominal aortic aneurysm, without rupture, unspecified: Secondary | ICD-10-CM

## 2019-01-14 MED ORDER — ASPIRIN EC 81 MG PO TBEC: 81.0000 mg | DELAYED_RELEASE_TABLET | Freq: Every day | ORAL | 2 refills | Status: DC

## 2019-01-14 NOTE — Progress Notes (Addendum)
Virtual Visit via Telephone Note  I connected with James Mullen on 01/14/2019 using the [email or web address] by telephone and verified that I was speaking with the correct person using two identifiers. Patient was located at home and accompanied by no one. I am located at our office on [facility].  This was initially scheduled as a video visit through [email or web address].  However, the patient had no audio available on his end.  This was converted to a phone visit.   The limitations of evaluation and management by telemedicine and the availability of in person appointments have been previously discussed with the patient and are documented in the patients chart. The patient expressed understanding and consented to proceed.  PCP: Biagio Borg, MD  History of Present Illness: James Mullen is a 68 y.o. 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  This was done by Dr. Prescott Gum and Dr. Amedeo Plenty.  Graft extended from the descending thoracic aorta to the renal arteries.  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 Amedeo Plenty.  He has done well since then.  Since his last visit with [place] he has undergone radiation seed placement for prostate cancer.  He does not use tobacco.  He has no abdominal or back pain.  His blood pressure has been well controlled and usually runs in the 1 66-2 20 range systolic over a diastolic of 70.  He is on atenolol.  Review of systems he denies chest pain.  He denies shortness of breath.  Past Medical History:  Diagnosis Date  . ANEMIA-NOS 07/08/2008  . ANXIETY 07/08/2008   Patient denies.  Marland Kitchen BENIGN PROSTATIC HYPERTROPHY 07/08/2008  . GERD 07/08/2008  . HYPERTENSION 07/08/2008  . INSOMNIA-SLEEP DISORDER-UNSPEC 07/08/2008  . 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 PERIPHERAL VASCULAR DISEASE 07/08/2008  . Prostate cancer ([place])   . PSA, INCREASED 07/08/2008  . Sleep apnea    ok if patient sleeps on his side. does  not use CPAP    Past Surgical History:  Procedure Laterality Date  . COLONOSCOPY  multiple, last 03/08/2011   up until 2012: no adenomas 2012: 2 diminutive polyps  . ILIAC ARTERY ANEURYSM REPAIR  2007  . POLYPECTOMY    . RADIOACTIVE SEED IMPLANT N/A 06/29/2018   Procedure: RADIOACTIVE SEED IMPLANT/BRACHYTHERAPY IMPLANT;  Surgeon: Cleon Gustin, MD;  Location: [facility];  Service: Urology;  Laterality: N/A;  2 HRS  . SPACE OAR INSTILLATION N/A 06/29/2018   Procedure: SPACE OAR INSTILLATION;  Surgeon: Cleon Gustin, MD;  Location[facility];  Service: Urology;  Laterality: N/A;  . THORACOABDOMINAL AORTIC ANEURYSM REPAIR  1997  . TONSILLECTOMY    . UVULOPALATOPHARYNGOPLASTY  1988    Current Meds  Medication Sig  . atenolol (TENORMIN) 50 MG tablet TAKE 1 TABLET BY MOUTH EVERY DAY  . Calcium Carbonate-Vitamin D (CALCIUM PLUS VITAMIN D PO) Take by mouth daily.  . Coenzyme Q10 (COQ10 PO) Take by mouth daily.  . Fish Oil OIL by Does not apply route daily.  . Glucosamine-Chondroitin-MSM 500-200-150 MG TABS Take 1 tablet by mouth daily.  . Nutritional Supplements (DHEA PO) Take by mouth daily.     Observations/Objective: I reviewed the images from the patient's CT scan of the abdomen and pelvis dated January 08, 2019.  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  This is unchanged  from CT scan in 2015.  The visualized portions of the lower thoracic graft are intact with no suture line anastomosis aneurysms.  The infrarenal repair also is intact with no evidence of pseudoaneurysm formation.  All of the findings of the CT scan were discussed with the patient today.  Assessment and Plan: Doing well status post thoracoabdominal repair for aortic dissection and infrarenal abdominal aortic repair for aneurysm.  No significant change in his a CT scan from 5 years ago.  Follow Up Instructions:    The patient will have a repeat CT angiogram  of the chest abdomen and pelvis in 5 years.  I told him that we did not schedule out that far so he will put a reminder on his calendar.  I also started the patient today on aspirin 81 mg once daily since there is evidence that people with aneurysms live longer with the combination of an aspirin and a statin.  I did discuss with the patient today that there is a small risk of bleeding complications with 81 mg of aspirin but this is fairly low and I believe the benefit outweighs this.  I will leave it at the discretion of Dr. Jenny Reichmann whether or not he wishes to start the statin.   I discussed the assessment and treatment plan with the patient. The patient was provided an opportunity to ask questions and all were answered. The patient agreed with the plan and demonstrated an understanding of the instructions.   The patient was advised to call back or seek an in-person evaluation if the symptoms worsen or if the condition fails to improve as anticipated.  I spent 10 minutes with the patient via telephone encounter.   Signed, Ruta Hinds [facility] Office: [phone]  01/14/2019, 8:52 AM

## 2019-03-04 DIAGNOSIS — R21 Rash and other nonspecific skin eruption: Secondary | ICD-10-CM | POA: Diagnosis not present

## 2019-04-08 DIAGNOSIS — C61 Malignant neoplasm of prostate: Secondary | ICD-10-CM | POA: Diagnosis not present

## 2019-04-15 DIAGNOSIS — R3912 Poor urinary stream: Secondary | ICD-10-CM | POA: Diagnosis not present

## 2019-04-15 DIAGNOSIS — C61 Malignant neoplasm of prostate: Secondary | ICD-10-CM | POA: Diagnosis not present

## 2019-06-02 ENCOUNTER — Encounter: Payer: Self-pay | Admitting: Internal Medicine

## 2019-06-02 ENCOUNTER — Other Ambulatory Visit: Payer: Self-pay

## 2019-06-02 ENCOUNTER — Ambulatory Visit (INDEPENDENT_AMBULATORY_CARE_PROVIDER_SITE_OTHER): Payer: Medicare HMO | Admitting: Internal Medicine

## 2019-06-02 ENCOUNTER — Other Ambulatory Visit (INDEPENDENT_AMBULATORY_CARE_PROVIDER_SITE_OTHER): Payer: Medicare HMO

## 2019-06-02 VITALS — BP 124/76 | HR 76 | Temp 97.7°F | Ht 74.0 in | Wt 199.0 lb

## 2019-06-02 DIAGNOSIS — I10 Essential (primary) hypertension: Secondary | ICD-10-CM

## 2019-06-02 DIAGNOSIS — E611 Iron deficiency: Secondary | ICD-10-CM | POA: Diagnosis not present

## 2019-06-02 DIAGNOSIS — E559 Vitamin D deficiency, unspecified: Secondary | ICD-10-CM

## 2019-06-02 DIAGNOSIS — M25562 Pain in left knee: Secondary | ICD-10-CM | POA: Diagnosis not present

## 2019-06-02 DIAGNOSIS — F32A Depression, unspecified: Secondary | ICD-10-CM

## 2019-06-02 DIAGNOSIS — M25561 Pain in right knee: Secondary | ICD-10-CM | POA: Diagnosis not present

## 2019-06-02 DIAGNOSIS — Z0001 Encounter for general adult medical examination with abnormal findings: Secondary | ICD-10-CM | POA: Diagnosis not present

## 2019-06-02 DIAGNOSIS — F329 Major depressive disorder, single episode, unspecified: Secondary | ICD-10-CM

## 2019-06-02 DIAGNOSIS — E538 Deficiency of other specified B group vitamins: Secondary | ICD-10-CM | POA: Diagnosis not present

## 2019-06-02 DIAGNOSIS — R739 Hyperglycemia, unspecified: Secondary | ICD-10-CM | POA: Diagnosis not present

## 2019-06-02 DIAGNOSIS — R972 Elevated prostate specific antigen [PSA]: Secondary | ICD-10-CM | POA: Diagnosis not present

## 2019-06-02 DIAGNOSIS — R69 Illness, unspecified: Secondary | ICD-10-CM | POA: Diagnosis not present

## 2019-06-02 DIAGNOSIS — F419 Anxiety disorder, unspecified: Secondary | ICD-10-CM

## 2019-06-02 LAB — URINALYSIS, ROUTINE W REFLEX MICROSCOPIC
Bilirubin Urine: NEGATIVE
Hgb urine dipstick: NEGATIVE
Ketones, ur: NEGATIVE
Leukocytes,Ua: NEGATIVE
Nitrite: NEGATIVE
Specific Gravity, Urine: 1.02 (ref 1.000–1.030)
Total Protein, Urine: NEGATIVE
Urine Glucose: NEGATIVE
Urobilinogen, UA: 0.2 (ref 0.0–1.0)
pH: 7.5 (ref 5.0–8.0)

## 2019-06-02 LAB — PSA: PSA: 1.26 ng/mL (ref 0.10–4.00)

## 2019-06-02 LAB — CBC WITH DIFFERENTIAL/PLATELET
Basophils Absolute: 0 10*3/uL (ref 0.0–0.1)
Basophils Relative: 0.6 % (ref 0.0–3.0)
Eosinophils Absolute: 0.1 10*3/uL (ref 0.0–0.7)
Eosinophils Relative: 2.4 % (ref 0.0–5.0)
HCT: 44.7 % (ref 39.0–52.0)
Hemoglobin: 15.2 g/dL (ref 13.0–17.0)
Lymphocytes Relative: 30.1 % (ref 12.0–46.0)
Lymphs Abs: 1.3 10*3/uL (ref 0.7–4.0)
MCHC: 34.1 g/dL (ref 30.0–36.0)
MCV: 84.8 fl (ref 78.0–100.0)
Monocytes Absolute: 0.5 10*3/uL (ref 0.1–1.0)
Monocytes Relative: 11.5 % (ref 3.0–12.0)
Neutro Abs: 2.4 10*3/uL (ref 1.4–7.7)
Neutrophils Relative %: 55.4 % (ref 43.0–77.0)
Platelets: 155 10*3/uL (ref 150.0–400.0)
RBC: 5.27 Mil/uL (ref 4.22–5.81)
RDW: 13.5 % (ref 11.5–15.5)
WBC: 4.3 10*3/uL (ref 4.0–10.5)

## 2019-06-02 LAB — HEPATIC FUNCTION PANEL
ALT: 13 U/L (ref 0–53)
AST: 12 U/L (ref 0–37)
Albumin: 4 g/dL (ref 3.5–5.2)
Alkaline Phosphatase: 60 U/L (ref 39–117)
Bilirubin, Direct: 0.1 mg/dL (ref 0.0–0.3)
Total Bilirubin: 0.7 mg/dL (ref 0.2–1.2)
Total Protein: 6.2 g/dL (ref 6.0–8.3)

## 2019-06-02 LAB — BASIC METABOLIC PANEL
BUN: 15 mg/dL (ref 6–23)
CO2: 31 mEq/L (ref 19–32)
Calcium: 9.1 mg/dL (ref 8.4–10.5)
Chloride: 107 mEq/L (ref 96–112)
Creatinine, Ser: 0.83 mg/dL (ref 0.40–1.50)
GFR: 92.31 mL/min (ref >60.00)
Glucose, Bld: 82 mg/dL (ref 70–99)
Potassium: 4.2 mEq/L (ref 3.5–5.1)
Sodium: 145 mEq/L (ref 135–145)

## 2019-06-02 LAB — HEMOGLOBIN A1C: Hgb A1c MFr Bld: 5.7 % (ref 4.6–6.5)

## 2019-06-02 LAB — IBC PANEL
Iron: 98 ug/dL (ref 42–165)
Saturation Ratios: 29 % (ref 20.0–50.0)
Transferrin: 241 mg/dL (ref 212.0–360.0)

## 2019-06-02 LAB — LIPID PANEL
Cholesterol: 148 mg/dL (ref 0–200)
HDL: 39.4 mg/dL (ref >39.00)
LDL Cholesterol: 93 mg/dL (ref 0–99)
NonHDL: 108.69
Total CHOL/HDL Ratio: 4
Triglycerides: 80 mg/dL (ref 0.0–149.0)
VLDL: 16 mg/dL (ref 0.0–40.0)

## 2019-06-02 LAB — TSH: TSH: 1.61 u[IU]/mL (ref 0.35–4.50)

## 2019-06-02 LAB — VITAMIN D 25 HYDROXY (VIT D DEFICIENCY, FRACTURES): VITD: 22.17 ng/mL — ABNORMAL LOW (ref 30.00–100.00)

## 2019-06-02 LAB — VITAMIN B12: Vitamin B-12: 328 pg/mL (ref 211–911)

## 2019-06-02 MED ORDER — ZOSTER VAC RECOMB ADJUVANTED 50 MCG/0.5ML IM SUSR: 0.5000 mL | Freq: Once | INTRAMUSCULAR | 1 refills | Status: AC

## 2019-06-02 NOTE — Patient Instructions (Addendum)
Your shingles shot prescription was sent to the pharmacy  .Please continue all other medications as before, and refills have been done if requested.  Please have the pharmacy call with any other refills you may need.  Please continue your efforts at being more active, low cholesterol diet, and weight control.  Please keep your appointments with your specialists as you may have planned  Please go to the LAB in the Basement (turn left off the elevator) for the tests to be done today  You will be contacted by phone if any changes need to be made immediately.  Otherwise, you will receive a letter about your results with an explanation, but please check with MyChart first.  Please remember to sign up for MyChart if you have not done so, as this will be important to you in the future with finding out test results, communicating by private email, and scheduling acute appointments online when needed.  Please return in 1 year for your yearly visit, or sooner if needed

## 2019-06-02 NOTE — Progress Notes (Signed)
Subjective:    Patient ID: James Mullen, male    DOB: 10/31/1950, 68 y.o.   MRN: [id number]  HPI  Here to f/u; overall doing ok,  Pt denies chest pain, increasing sob or doe, wheezing, orthopnea, PND, increased LE swelling, palpitations, dizziness or syncope.  Pt denies new neurological symptoms such as new headache, or facial or extremity weakness or numbness.  Pt denies polydipsia, polyuria, or low sugar episode.  Pt states overall good compliance with meds, mostly trying to follow appropriate diet, with wt overall stable,  but little exercise however.  Denies urinary symptoms such as dysuria, frequency, urgency, flank pain, hematuria or n/v, fever, chills. Denies worsening depressive symptoms, suicidal ideation, or panic Past Medical History:  Diagnosis Date  . ANEMIA-NOS 07/08/2008  . ANXIETY 07/08/2008   Patient denies.  Marland Kitchen BENIGN PROSTATIC HYPERTROPHY 07/08/2008  . GERD 07/08/2008  . HYPERTENSION 07/08/2008  . INSOMNIA-SLEEP DISORDER-UNSPEC 07/08/2008  . 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 PERIPHERAL VASCULAR DISEASE 07/08/2008  . Prostate cancer ([place])   . PSA, INCREASED 07/08/2008  . Sleep apnea    ok if patient sleeps on his side. does not use CPAP   Past Surgical History:  Procedure Laterality Date  . COLONOSCOPY  multiple, last 03/08/2011   up until 2012: no adenomas 2012: 2 diminutive polyps  . ILIAC ARTERY ANEURYSM REPAIR  2007  . POLYPECTOMY    . RADIOACTIVE SEED IMPLANT N/A 06/29/2018   Procedure: RADIOACTIVE SEED IMPLANT/BRACHYTHERAPY IMPLANT;  Surgeon: Cleon Gustin, MD;  Location: [facility];  Service: Urology;  Laterality: N/A;  2 HRS  . SPACE OAR INSTILLATION N/A 06/29/2018   Procedure: SPACE OAR INSTILLATION;  Surgeon: Cleon Gustin, MD;  Location: [facility];  Service: Urology;  Laterality: N/A;  . THORACOABDOMINAL AORTIC ANEURYSM REPAIR  1997  . TONSILLECTOMY    . UVULOPALATOPHARYNGOPLASTY  1988    reports that he has never smoked. He  has never used smokeless tobacco. He reports current alcohol use of about 2.0 standard drinks of alcohol per week. He reports that he does not use drugs. family history includes Breast cancer in his mother; Colon cancer (age of onset: 37) in his father. Allergies  Allergen Reactions  . Penicillins Hives   Current Outpatient Medications on File Prior to Visit  Medication Sig Dispense Refill  . aspirin EC 81 MG tablet Take 1 tablet (81 mg total) by mouth daily. 150 tablet 2  . atenolol (TENORMIN) 50 MG tablet TAKE 1 TABLET BY MOUTH EVERY DAY 90 tablet 3  . Calcium Carbonate-Vitamin D (CALCIUM PLUS VITAMIN D PO) Take by mouth daily.    . Coenzyme Q10 (COQ10 PO) Take by mouth daily.    . Fish Oil OIL by Does not apply route daily.    . Glucosamine-Chondroitin-MSM 500-200-150 MG TABS Take 1 tablet by mouth daily.    . Nutritional Supplements (DHEA PO) Take by mouth daily.    . TRINTELLIX 20 MG TABS tablet Take 20 mg by mouth daily.     No current facility-administered medications on file prior to visit.    Review of Systems  Constitutional: Negative for other unusual diaphoresis or sweats HENT: Negative for ear discharge or swelling Eyes: Negative for other worsening visual disturbances Respiratory: Negative for stridor or other swelling  Gastrointestinal: Negative for worsening distension or other blood Genitourinary: Negative for retention or other urinary change Musculoskeletal: Negative for other MSK pain or swelling Skin: Negative for color change or other  new lesions Neurological: Negative for worsening tremors and other numbness  Psychiatric/Behavioral: Negative for worsening agitation or other fatigue All otherwise neg per pt     Objective:   Physical Exam BP 124/76   Pulse 76   Temp 97.7 F (36.5 C) (Oral)   Ht 6\' 2"  (1.88 m)   Wt 199 lb (90.3 kg)   SpO2 97%   BMI 25.55 kg/m  VS noted,  Constitutional: Pt appears in NAD HENT: Head: NCAT.  Right Ear: External ear  normal.  Left Ear: External ear normal.  Eyes: . Pupils are equal, round, and reactive to light. Conjunctivae and EOM are normal Nose: without d/c or deformity Neck: Neck supple. Gross normal ROM Cardiovascular: Normal rate and regular rhythm.   Pulmonary/Chest: Effort normal and breath sounds without rales or wheezing.  Abd:  Soft, NT, ND, + BS, no organomegaly Neurological: Pt is alert. At baseline orientation, motor grossly intact Skin: Skin is warm. No rashes, other new lesions, no LE edema Psychiatric: Pt behavior is normal without agitation  All otherwise neg per pt  Lab Results  Component Value Date   WBC 5.1 06/22/2018   HGB 15.1 06/22/2018   HCT 46.3 06/22/2018   PLT 149 (L) 06/22/2018   GLUCOSE 83 06/22/2018   CHOL 145 05/27/2018   TRIG 126.0 05/27/2018   HDL 43.70 05/27/2018   LDLDIRECT 98.0 11/27/2016   LDLCALC 76 05/27/2018   ALT 16 06/22/2018   AST 18 06/22/2018   NA 142 06/22/2018   K 4.2 06/22/2018   CL 106 06/22/2018   CREATININE 0.85 06/22/2018   BUN 16 06/22/2018   CO2 28 06/22/2018   TSH 1.65 05/27/2018   PSA 7.03 (H) 01/21/2018   INR 0.97 06/22/2018      Assessment & Plan:

## 2019-06-02 NOTE — Assessment & Plan Note (Signed)
Now doing PT type exercise with improvement per a chiropracter friend, cont to follow

## 2019-06-03 ENCOUNTER — Encounter: Payer: Self-pay | Admitting: Internal Medicine

## 2019-06-03 ENCOUNTER — Other Ambulatory Visit: Payer: Self-pay | Admitting: Internal Medicine

## 2019-06-03 MED ORDER — VITAMIN D (ERGOCALCIFEROL) 1.25 MG (50000 UNIT) PO CAPS: 50000.0000 [IU] | ORAL_CAPSULE | ORAL | 0 refills | Status: DC

## 2019-06-05 ENCOUNTER — Encounter: Payer: Self-pay | Admitting: Internal Medicine

## 2019-06-05 NOTE — Assessment & Plan Note (Signed)
stable overall by history and exam, recent data reviewed with pt, and pt to continue medical treatment as before,  to f/u any worsening symptoms or concerns  

## 2019-08-26 ENCOUNTER — Other Ambulatory Visit: Payer: Self-pay | Admitting: Internal Medicine

## 2019-10-28 DIAGNOSIS — C61 Malignant neoplasm of prostate: Secondary | ICD-10-CM | POA: Diagnosis not present

## 2019-11-04 DIAGNOSIS — C61 Malignant neoplasm of prostate: Secondary | ICD-10-CM | POA: Diagnosis not present

## 2019-11-04 DIAGNOSIS — R3912 Poor urinary stream: Secondary | ICD-10-CM | POA: Diagnosis not present

## 2019-11-16 ENCOUNTER — Other Ambulatory Visit: Payer: Self-pay | Admitting: Internal Medicine

## 2019-11-16 NOTE — Telephone Encounter (Signed)
Please refill as per office routine med refill policy (all routine meds refilled for 3 mo or monthly per pt preference up to one year from last visit, then month to month grace period for 3 mo, then further med refills will have to be denied)  

## 2019-11-24 ENCOUNTER — Other Ambulatory Visit: Payer: Self-pay | Admitting: Internal Medicine

## 2019-11-24 NOTE — Telephone Encounter (Signed)
This medication has not been rx by me in the past, I suspect it may have come per psychiatry, and please suggest to patient that he should f/u there for refill request

## 2019-11-30 NOTE — Telephone Encounter (Signed)
LM for call back to inform pt that of Dr Gwynn Burly response.

## 2019-12-01 ENCOUNTER — Telehealth: Payer: Self-pay

## 2019-12-01 NOTE — Telephone Encounter (Signed)
1.Medication Requested:TRINTELLIX 20 MG TABS tablet  2. Pharmacy (Name, Street, City):[id number] - [place], [place] [address]  3. On Med List: Yes   4. Last Visit with PCP: 12.2.20   5. Next visit date with PCP: 12.3.21    Agent: Please be advised that RX refills may take up to 3 business days. We ask that you follow-up with your pharmacy.

## 2019-12-02 NOTE — Telephone Encounter (Signed)
Same answer as before, thanks

## 2019-12-03 NOTE — Telephone Encounter (Signed)
Sorry, not sure what happened, I just remember that I meant to say this appears to have been rx I believe by psychiatry, so I was hoping he would ask for refill there, thanks

## 2019-12-06 NOTE — Telephone Encounter (Signed)
F/u    The patient has a bottle with the Dr. Jenny Reichmann who prescribe TRINTELLIX 20 MG TABS tablet   Date on bottle 8.31.20  Prescribe Dr. Jenny Reichmann    [id number] - [place], [place]

## 2019-12-09 NOTE — Telephone Encounter (Signed)
The chart clearly shows that I did one month of trintillix refill for this patient in late 2019.  This was likely a courtesy refill only  I do not normally prescribe this medication, and would not feel comfortable with ongoing tx with this medication  I can refer to psychiatry if her prefers  thanks

## 2019-12-10 NOTE — Telephone Encounter (Signed)
New message:   Pt's wife has called and states that we can cancel the referral being sent to the Psychiatrist. Please advise.

## 2019-12-10 NOTE — Telephone Encounter (Signed)
Contacted patient today.  He says that he is aware that Dr Jenny Reichmann wrote the prescription meant to cover him until he was able to see psychiatry & states he doesn't "need the prescription that bad" as he only uses it when he gets anxious.  He understand Dr Gwynn Burly response & is encouraged to reach out if referrals needed.

## 2019-12-10 NOTE — Telephone Encounter (Signed)
Spoke with pt wife and pt and pt is awaiting referral for psychiatry. Dr. Jenny Reichmann will send it in when he can. *I informed pt the referral will be sent and they will be contacted by the referral team with the date and time.

## 2019-12-14 NOTE — Telephone Encounter (Signed)
Sent to Dr. John to advise. 

## 2019-12-14 NOTE — Telephone Encounter (Signed)
Ok to cancel referral

## 2020-05-01 ENCOUNTER — Other Ambulatory Visit: Payer: Self-pay

## 2020-05-01 DIAGNOSIS — C61 Malignant neoplasm of prostate: Secondary | ICD-10-CM

## 2020-05-10 ENCOUNTER — Ambulatory Visit: Payer: Medicare HMO | Admitting: Urology

## 2020-05-31 ENCOUNTER — Other Ambulatory Visit: Payer: Self-pay

## 2020-05-31 ENCOUNTER — Other Ambulatory Visit: Payer: Medicare HMO

## 2020-05-31 DIAGNOSIS — C61 Malignant neoplasm of prostate: Secondary | ICD-10-CM | POA: Diagnosis not present

## 2020-06-01 ENCOUNTER — Other Ambulatory Visit: Payer: Self-pay

## 2020-06-01 LAB — PSA: Prostate Specific Ag, Serum: 0.7 ng/mL (ref 0.0–4.0)

## 2020-06-02 ENCOUNTER — Encounter: Payer: Self-pay | Admitting: Internal Medicine

## 2020-06-02 ENCOUNTER — Other Ambulatory Visit (INDEPENDENT_AMBULATORY_CARE_PROVIDER_SITE_OTHER): Payer: Medicare HMO

## 2020-06-02 ENCOUNTER — Ambulatory Visit (INDEPENDENT_AMBULATORY_CARE_PROVIDER_SITE_OTHER): Payer: Medicare HMO | Admitting: Internal Medicine

## 2020-06-02 VITALS — BP 130/82 | HR 65 | Temp 98.3°F | Ht 74.0 in | Wt 192.0 lb

## 2020-06-02 DIAGNOSIS — E559 Vitamin D deficiency, unspecified: Secondary | ICD-10-CM | POA: Insufficient documentation

## 2020-06-02 DIAGNOSIS — J069 Acute upper respiratory infection, unspecified: Secondary | ICD-10-CM

## 2020-06-02 DIAGNOSIS — I10 Essential (primary) hypertension: Secondary | ICD-10-CM

## 2020-06-02 DIAGNOSIS — Z0001 Encounter for general adult medical examination with abnormal findings: Secondary | ICD-10-CM | POA: Diagnosis not present

## 2020-06-02 DIAGNOSIS — R739 Hyperglycemia, unspecified: Secondary | ICD-10-CM

## 2020-06-02 DIAGNOSIS — I716 Thoracoabdominal aortic aneurysm, without rupture, unspecified: Secondary | ICD-10-CM

## 2020-06-02 DIAGNOSIS — C61 Malignant neoplasm of prostate: Secondary | ICD-10-CM

## 2020-06-02 LAB — LIPID PANEL
Cholesterol: 163 mg/dL (ref 0–200)
HDL: 40.9 mg/dL (ref >39.00)
LDL Cholesterol: 108 mg/dL — ABNORMAL HIGH (ref 0–99)
NonHDL: 122.33
Total CHOL/HDL Ratio: 4
Triglycerides: 72 mg/dL (ref 0.0–149.0)
VLDL: 14.4 mg/dL (ref 0.0–40.0)

## 2020-06-02 LAB — BASIC METABOLIC PANEL
BUN: 16 mg/dL (ref 6–23)
CO2: 28 mEq/L (ref 19–32)
Calcium: 9 mg/dL (ref 8.4–10.5)
Chloride: 106 mEq/L (ref 96–112)
Creatinine, Ser: 0.92 mg/dL (ref 0.40–1.50)
GFR: 85.56 mL/min (ref >60.00)
Glucose, Bld: 105 mg/dL — ABNORMAL HIGH (ref 70–99)
Potassium: 4.1 mEq/L (ref 3.5–5.1)
Sodium: 141 mEq/L (ref 135–145)

## 2020-06-02 LAB — HEPATIC FUNCTION PANEL
ALT: 13 U/L (ref 0–53)
AST: 14 U/L (ref 0–37)
Albumin: 4.2 g/dL (ref 3.5–5.2)
Alkaline Phosphatase: 65 U/L (ref 39–117)
Bilirubin, Direct: 0.1 mg/dL (ref 0.0–0.3)
Total Bilirubin: 0.7 mg/dL (ref 0.2–1.2)
Total Protein: 6.9 g/dL (ref 6.0–8.3)

## 2020-06-02 LAB — SARS-COV-2 IGG: SARS-COV-2 IgG: 0.06

## 2020-06-02 LAB — TSH: TSH: 1.92 u[IU]/mL (ref 0.35–4.50)

## 2020-06-02 LAB — VITAMIN D 25 HYDROXY (VIT D DEFICIENCY, FRACTURES): VITD: 29.04 ng/mL — ABNORMAL LOW (ref 30.00–100.00)

## 2020-06-02 LAB — CBC WITH DIFFERENTIAL/PLATELET
Basophils Absolute: 0.1 10*3/uL (ref 0.0–0.1)
Basophils Relative: 1.2 % (ref 0.0–3.0)
Eosinophils Absolute: 0.1 10*3/uL (ref 0.0–0.7)
Eosinophils Relative: 1 % (ref 0.0–5.0)
HCT: 46.1 % (ref 39.0–52.0)
Hemoglobin: 15.6 g/dL (ref 13.0–17.0)
Lymphocytes Relative: 30.4 % (ref 12.0–46.0)
Lymphs Abs: 1.9 10*3/uL (ref 0.7–4.0)
MCHC: 33.9 g/dL (ref 30.0–36.0)
MCV: 83.6 fl (ref 78.0–100.0)
Monocytes Absolute: 0.6 10*3/uL (ref 0.1–1.0)
Monocytes Relative: 10.2 % (ref 3.0–12.0)
Neutro Abs: 3.5 10*3/uL (ref 1.4–7.7)
Neutrophils Relative %: 57.2 % (ref 43.0–77.0)
Platelets: 148 10*3/uL — ABNORMAL LOW (ref 150.0–400.0)
RBC: 5.51 Mil/uL (ref 4.22–5.81)
RDW: 13.9 % (ref 11.5–15.5)
WBC: 6.2 10*3/uL (ref 4.0–10.5)

## 2020-06-02 LAB — URINALYSIS, ROUTINE W REFLEX MICROSCOPIC
Bilirubin Urine: NEGATIVE
Ketones, ur: NEGATIVE
Leukocytes,Ua: NEGATIVE
Nitrite: NEGATIVE
Specific Gravity, Urine: >=1.03 — AB (ref 1.000–1.030)
Total Protein, Urine: NEGATIVE
Urine Glucose: NEGATIVE
Urobilinogen, UA: 0.2 (ref 0.0–1.0)
pH: 6 (ref 5.0–8.0)

## 2020-06-02 LAB — HEMOGLOBIN A1C: Hgb A1c MFr Bld: 5.8 % (ref 4.6–6.5)

## 2020-06-02 NOTE — Progress Notes (Signed)
Subjective:    Patient ID: James Mullen, male    DOB: Aug 01, 1950, 69 y.o.   MRN: [id number]  HPI  Here for wellness and f/u;  Overall doing ok;  Pt denies Chest pain, worsening SOB, DOE, wheezing, orthopnea, PND, worsening LE edema, palpitations, dizziness or syncope.  Pt denies neurological change such as new headache, facial or extremity weakness.  Pt denies polydipsia, polyuria, or low sugar symptoms. Pt states overall good compliance with treatment and medications, good tolerability, and has been trying to follow appropriate diet.  Pt denies worsening depressive symptoms, suicidal ideation or panic. No fever, night sweats, wt loss, loss of appetite, or other constitutional symptoms.  Pt states good ability with ADL's, has low fall risk, home safety reviewed and adequate, no other significant changes in hearing or vision, and only occasionally active with exercise. Also, had ? URI symptoms in the past year, asking for SARS anibody check. Due for urology next wed.   Past Medical History:  Diagnosis Date  . ANEMIA-NOS 07/08/2008  . ANXIETY 07/08/2008   Patient denies.  Marland Kitchen BENIGN PROSTATIC HYPERTROPHY 07/08/2008  . GERD 07/08/2008  . HYPERTENSION 07/08/2008  . INSOMNIA-SLEEP DISORDER-UNSPEC 07/08/2008  . 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 PERIPHERAL VASCULAR DISEASE 07/08/2008  . Prostate cancer ([place])   . PSA, INCREASED 07/08/2008  . Sleep apnea    ok if patient sleeps on his side. does not use CPAP   Past Surgical History:  Procedure Laterality Date  . COLONOSCOPY  multiple, last 03/08/2011   up until 2012: no adenomas 2012: 2 diminutive polyps  . ILIAC ARTERY ANEURYSM REPAIR  2007  . POLYPECTOMY    . RADIOACTIVE SEED IMPLANT N/A 06/29/2018   Procedure: RADIOACTIVE SEED IMPLANT/BRACHYTHERAPY IMPLANT;  Surgeon: Cleon Gustin, MD;  Location: [facility];  Service: Urology;  Laterality: N/A;  2 HRS  . SPACE OAR INSTILLATION N/A 06/29/2018   Procedure: SPACE OAR INSTILLATION;  Surgeon:  Cleon Gustin, MD;  Location[facility];  Service: Urology;  Laterality: N/A;  . THORACOABDOMINAL AORTIC ANEURYSM REPAIR  1997  . TONSILLECTOMY    . UVULOPALATOPHARYNGOPLASTY  1988    reports that he has never smoked. He has never used smokeless tobacco. He reports current alcohol use of about 2.0 standard drinks of alcohol per week. He reports that he does not use drugs. family history includes Breast cancer in his mother; Colon cancer (age of onset: 51) in his father. Allergies  Allergen Reactions  . Penicillins Hives   Current Outpatient Medications on File Prior to Visit  Medication Sig Dispense Refill  . aspirin EC 81 MG tablet Take 1 tablet (81 mg total) by mouth daily. 150 tablet 2  . atenolol (TENORMIN) 50 MG tablet TAKE 1 TABLET BY MOUTH EVERY DAY Annual appt due in Dec must see provider for future refills. 90 tablet 1  . Calcium Carbonate-Vitamin D (CALCIUM PLUS VITAMIN D PO) Take by mouth daily.    . Coenzyme Q10 (COQ10 PO) Take by mouth daily.    . Fish Oil OIL by Does not apply route daily.    . Glucosamine-Chondroitin-MSM 500-200-150 MG TABS Take 1 tablet by mouth daily.    . Nutritional Supplements (DHEA PO) Take by mouth daily.    . TRINTELLIX 20 MG TABS tablet Take 20 mg by mouth daily.     No current facility-administered medications on file prior to visit.   Review of Systems All otherwise neg per pt  Objective:   Physical Exam BP 130/82 (BP Location: Left Arm, Patient Position: Sitting, Cuff Size: Large)   Pulse 65   Temp 98.3 F (36.8 C) (Oral)   Ht 6\' 2"  (1.88 m)   Wt 192 lb (87.1 kg)   SpO2 97%   BMI 24.65 kg/m  VS noted,  Constitutional: Pt appears in NAD HENT: Head: NCAT.  Right Ear: External ear normal.  Left Ear: External ear normal.  Eyes: . Pupils are equal, round, and reactive to light. Conjunctivae and EOM are normal Nose: without d/c or deformity Neck: Neck supple. Gross normal ROM Cardiovascular: Normal rate and  regular rhythm.   Pulmonary/Chest: Effort normal and breath sounds without rales or wheezing.  Abd:  Soft, NT, ND, + BS, no organomegaly Neurological: Pt is alert. At baseline orientation, motor grossly intact Skin: Skin is warm. No rashes, other new lesions, no LE edema Psychiatric: Pt behavior is normal without agitation  All otherwise neg per pt Lab Results  Component Value Date   WBC 6.2 06/02/2020   HGB 15.6 06/02/2020   HCT 46.1 06/02/2020   PLT 148.0 (L) 06/02/2020   GLUCOSE 105 (H) 06/02/2020   CHOL 163 06/02/2020   TRIG 72.0 06/02/2020   HDL 40.90 06/02/2020   LDLDIRECT 98.0 11/27/2016   LDLCALC 108 (H) 06/02/2020   ALT 13 06/02/2020   AST 14 06/02/2020   NA 141 06/02/2020   K 4.1 06/02/2020   CL 106 06/02/2020   CREATININE 0.92 06/02/2020   BUN 16 06/02/2020   CO2 28 06/02/2020   TSH 1.92 06/02/2020   PSA 1.26 06/02/2019   INR 0.97 06/22/2018   HGBA1C 5.8 06/02/2020          Assessment & Plan:

## 2020-06-02 NOTE — Patient Instructions (Signed)

## 2020-06-03 ENCOUNTER — Encounter: Payer: Self-pay | Admitting: Internal Medicine

## 2020-06-03 NOTE — Assessment & Plan Note (Signed)

## 2020-06-03 NOTE — Assessment & Plan Note (Signed)
stable overall by history and exam, recent data reviewed with pt, and pt to continue medical treatment as before,  to f/u any worsening symptoms or concerns  

## 2020-06-03 NOTE — Assessment & Plan Note (Signed)
stable overall by history and exam, recent data reviewed with pt, and pt to continue medical treatment as before,  to f/u any worsening symptoms or concerns,  

## 2020-06-03 NOTE — Assessment & Plan Note (Signed)
For psa, f/u urology as planned

## 2020-06-03 NOTE — Assessment & Plan Note (Addendum)
For sar covid igg testing,  to f/u any worsening symptoms or concerns  I spent 31 minutes in addition to time for CPX wellness examination in preparing to see the patient by review of recent labs, imaging and procedures, obtaining and reviewing separately obtained history, communicating with the patient and family or caregiver, ordering medications, tests or procedures, and documenting clinical information in the EHR including the differential Dx, treatment, and any further evaluation and other management of uri, prostate ca, vit d def, aortic anueyrsm, hyperglycemia, htn

## 2020-06-03 NOTE — Assessment & Plan Note (Signed)
Cont oral replaement

## 2020-06-07 ENCOUNTER — Other Ambulatory Visit: Payer: Self-pay

## 2020-06-07 ENCOUNTER — Encounter: Payer: Self-pay | Admitting: Urology

## 2020-06-07 ENCOUNTER — Ambulatory Visit (INDEPENDENT_AMBULATORY_CARE_PROVIDER_SITE_OTHER): Payer: Medicare HMO | Admitting: Urology

## 2020-06-07 VITALS — BP 124/81 | HR 70 | Wt 199.0 lb

## 2020-06-07 DIAGNOSIS — N138 Other obstructive and reflux uropathy: Secondary | ICD-10-CM

## 2020-06-07 DIAGNOSIS — C61 Malignant neoplasm of prostate: Secondary | ICD-10-CM

## 2020-06-07 DIAGNOSIS — N401 Enlarged prostate with lower urinary tract symptoms: Secondary | ICD-10-CM | POA: Diagnosis not present

## 2020-06-07 DIAGNOSIS — R351 Nocturia: Secondary | ICD-10-CM | POA: Insufficient documentation

## 2020-06-07 LAB — URINALYSIS
Bilirubin, UA: NEGATIVE
Glucose, UA: NEGATIVE
Ketones, UA: NEGATIVE
Leukocytes,UA: NEGATIVE
Nitrite, UA: NEGATIVE
Protein,UA: NEGATIVE
Specific Gravity, UA: 1.025 (ref 1.005–1.030)
Urobilinogen, Ur: 0.2 mg/dL (ref 0.2–1.0)
pH, UA: 7 (ref 5.0–7.5)

## 2020-06-07 NOTE — Addendum Note (Signed)
Addended by: Valentina Lucks on: 06/07/2020 04:06 PM   Modules accepted: Orders

## 2020-06-07 NOTE — Progress Notes (Signed)
Urological Symptom Review  Patient is experiencing the following symptoms: Frequent urination Hard to postpone urination Get up at night to urinate   Review of Systems  Gastrointestinal (upper)  : Negative for upper GI symptoms  Gastrointestinal (lower) : Negative for lower GI symptoms  Constitutional : Fatigue  Skin: Negative for skin symptoms  Eyes: Negative for eye symptoms  Ear/Nose/Throat : Sinus problems  Hematologic/Lymphatic: Negative for Hematologic/Lymphatic symptoms  Cardiovascular : Negative for cardiovascular symptoms  Respiratory : Negative for respiratory symptoms  Endocrine: Negative for endocrine symptoms  Musculoskeletal: Joint pain  Neurological: Negative for neurological symptoms  Psychologic: Negative for psychiatric symptoms

## 2020-06-07 NOTE — Progress Notes (Signed)
06/07/2020 9:49 AM   James Mullen 06/24/1951 [id number]  Referring provider: Biagio Borg, MD [place],  [place] [address]  Followup prostate cancer  HPI: James Mullen is a 69yo here for followup for Prostate cancer and BPH. PSA stable at 0.7. he ha smild urgency, nocturia 1x and intermittent weak stream. He has stopped his flomax. He is happy with his urination.    His records from [facility] are as follows: I have prostate cancer.  HPI: James Mullen is a 69 year-old male established patient who is here evaluation for treatment of prostate cancer.  His prostate cancer was diagnosed 04/02/2018. He does have the pathology report from his biopsy. His cancer was diagnosed by Nicolette Bang. His PSA at his time of diagnosis was 7. His most recent PSA is 1.06.   He has not undergone surgery for treatment. He has not undergone External Beam Radiation Therapy for treatment. He has not undergone Hormonal Therapy for treatment.   He does not have urinary incontinence. He does not have problems with erectile dysfunction. He has not recently had unwanted weight loss. He is not having pain in new locations.   04/09/2018: Prostate biopsy revealed Gleason 3+3=6 in 3/12 cores, 5% of core and Gleason 3+4=7 in 1/12 cores 10% of core.   07/03/2018: s/p brachytherapy with spaceOAR. voiding trial passed today   10/13/2018: PSA decreased to 1.8. NO new LUTS. he is decreasing the flomax to QOD   04/15/2019: PSa decreased to 1.07   11/04/2019: PSA decreased to 1.06. He had dysuria 2 weeks ago that resolved     CC: I have weakness and slowing of my urinary stream.  HPI: He first noticed the symptom 07/03/2018. He does have to strain or bear down to start his urinary stream. He does have to wait a long time to start his urinary stream.   He does have an abnormal sensation when needing to urinate. He does not urinate more frequently than once every 4 hours in the daytime. He does not dribble  at the end of urination. He does not have a split stream when he urinates.   He has not been treated with medications in the past for his lower urinary tract symptoms. The patient has never had a surgical procedure for bladder outlet obstruction to his prostate.   10/13/2018: He was started on flomax last visit for obstructive urinary symptoms. Stream is strong. He has decreased the flomax to QOD   04/15/2019: His LUTS worsened 3-4 weeks ago. Stream is weak. Nocturia increased to 2-3x. He was previously on flomax but stopped the medication   11/04/2019: He has new urinary frequency and urgency which is mildly bothersome. He is off his flomax. stream is strong     AUA Symptom Score: Less than 50% of the time he has the sensation of not emptying his bladder completely when finished urinating. Less than 20% of the time he has to urinate again fewer than two hours after he has finished urinating. Less than 50% of the time he has to start and stop again several times when he urinates. He never finds it difficult to postpone urination. He never has a weak urinary stream. Less than 50% of the time he has to push or strain to begin urination. He has to get up to urinate 3 times from the time he goes to bed until the time he gets up in the morning.   Calculated AUA Symptom Score: 10  PMH: Past Medical History:  Diagnosis Date  . ANEMIA-NOS 07/08/2008  . ANXIETY 07/08/2008   Patient denies.  Marland Kitchen BENIGN PROSTATIC HYPERTROPHY 07/08/2008  . GERD 07/08/2008  . HYPERTENSION 07/08/2008  . INSOMNIA-SLEEP DISORDER-UNSPEC 07/08/2008  . 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 PERIPHERAL VASCULAR DISEASE 07/08/2008  . Prostate cancer ([place])   . PSA, INCREASED 07/08/2008  . Sleep apnea    ok if patient sleeps on his side. does not use CPAP    Surgical History: Past Surgical History:  Procedure Laterality Date  . COLONOSCOPY  multiple, last 03/08/2011   up until 2012: no adenomas 2012: 2 diminutive polyps  . ILIAC ARTERY ANEURYSM  REPAIR  2007  . POLYPECTOMY    . RADIOACTIVE SEED IMPLANT N/A 06/29/2018   Procedure: RADIOACTIVE SEED IMPLANT/BRACHYTHERAPY IMPLANT;  Surgeon: Cleon Gustin, MD;  Location: [facility];  Service: Urology;  Laterality: N/A;  2 HRS  . SPACE OAR INSTILLATION N/A 06/29/2018   Procedure: SPACE OAR INSTILLATION;  Surgeon: Cleon Gustin, MD;  Location: [facility];  Service: Urology;  Laterality: N/A;  . THORACOABDOMINAL AORTIC ANEURYSM REPAIR  1997  . TONSILLECTOMY    . UVULOPALATOPHARYNGOPLASTY  1988    Home Medications:  Allergies as of 06/07/2020      Reactions   Penicillins Hives      Medication List       Accurate as of June 07, 2020  9:49 AM. If you have any questions, ask your nurse or doctor.        STOP taking these medications   Trintellix 20 MG Tabs tablet Generic drug: vortioxetine HBr Stopped by: Nicolette Bang, MD     TAKE these medications   aspirin EC 81 MG tablet Take 1 tablet (81 mg total) by mouth daily.   atenolol 50 MG tablet Commonly known as: TENORMIN TAKE 1 TABLET BY MOUTH EVERY DAY Annual appt due in Dec must see provider for future refills.   CALCIUM PLUS VITAMIN D PO Take by mouth daily.   COQ10 PO Take by mouth daily.   DHEA PO Take by mouth daily.   Fish Oil Oil by Does not apply route daily.   Glucosamine-Chondroitin-MSM 500-200-150 MG Tabs Take 1 tablet by mouth daily.       Allergies:  Allergies  Allergen Reactions  . Penicillins Hives    Family History: Family History  Problem Relation Age of Onset  . Colon cancer Father 53  . Breast cancer Mother   . Esophageal cancer Neg Hx   . Rectal cancer Neg Hx   . Stomach cancer Neg Hx   . Pancreatic cancer Neg Hx   . Prostate cancer Neg Hx     Social History:  reports that he has never smoked. He has never used smokeless tobacco. He reports current alcohol use of about 2.0 standard drinks of alcohol per week. He reports that he  does not use drugs.  ROS: All other review of systems were reviewed and are negative except what is noted above in HPI  Physical Exam: BP 124/81   Pulse 70   Wt 199 lb (90.3 kg)   BMI 25.55 kg/m   Constitutional:  Alert and oriented, No acute distress. HEENT: [place] AT, moist mucus membranes.  Trachea midline, no masses. Cardiovascular: No clubbing, cyanosis, or edema. Respiratory: Normal respiratory effort, no increased work of breathing. GI: Abdomen is soft, nontender, nondistended, no abdominal masses GU: No CVA tenderness. Circumcised phallus. No masses/lesions on penis, testis,  scrotum. Prostate 40g smooth no nodules no induration.  Lymph: No cervical or inguinal lymphadenopathy. Skin: No rashes, bruises or suspicious lesions. Neurologic: Grossly intact, no focal deficits, moving all 4 extremities. Psychiatric: Normal mood and affect.  Laboratory Data: Lab Results  Component Value Date   WBC 6.2 06/02/2020   HGB 15.6 06/02/2020   HCT 46.1 06/02/2020   MCV 83.6 06/02/2020   PLT 148.0 (L) 06/02/2020    Lab Results  Component Value Date   CREATININE 0.92 06/02/2020    Lab Results  Component Value Date   PSA 1.26 06/02/2019   PSA 7.03 (H) 01/21/2018   PSA 6.03 (H) 08/19/2017    No results found for: TESTOSTERONE  Lab Results  Component Value Date   HGBA1C 5.8 06/02/2020    Urinalysis    Component Value Date/Time   COLORURINE YELLOW 06/02/2020 0908   APPEARANCEUR CLEAR 06/02/2020 0908   LABSPEC >=1.030 (A) 06/02/2020 0908   PHURINE 6.0 06/02/2020 0908   GLUCOSEU NEGATIVE 06/02/2020 0908   HGBUR TRACE-INTACT (A) 06/02/2020 0908   BILIRUBINUR NEGATIVE 06/02/2020 0908   KETONESUR NEGATIVE 06/02/2020 0908   UROBILINOGEN 0.2 06/02/2020 0908   NITRITE NEGATIVE 06/02/2020 0908   LEUKOCYTESUR NEGATIVE 06/02/2020 0908    Lab Results  Component Value Date   MUCUS Presence of (A) 06/02/2020    Pertinent Imaging:  No results found for this or any previous  visit.  No results found for this or any previous visit.  No results found for this or any previous visit.  No results found for this or any previous visit.  No results found for this or any previous visit.  No results found for this or any previous visit.  No results found for this or any previous visit.  No results found for this or any previous visit.   Assessment & Plan:    1. Benign prostatic hyperplasia with urinary obstruction -we will continue to hold flomax  2. Prostate cancer ([place]) -RTC 6 months with PSA   No follow-ups on file.  Nicolette Bang, MD  [facility] [place]

## 2020-06-07 NOTE — Patient Instructions (Signed)
Prostate Cancer  The prostate is a male gland that helps make semen. Prostate cancer is when abnormal cells grow in this gland. Follow these instructions at home:  Take over-the-counter and prescription medicines only as told by your doctor.  Eat a healthy diet.  Get plenty of sleep.  Ask your doctor for help to find a support group for men with prostate cancer.  Keep all follow-up visits as told by your doctor. This is important.  If you have to go to the hospital, let your cancer doctor (oncologist) know.  Touch, hold, hug, and caress your partner to continue to show sexual feelings. Contact a doctor if:  You have trouble peeing (urinating).  You have blood in your pee (urine).  You have pain in your hips, back, or chest. Get help right away if:  You have weakness in your legs.  You lose feeling (have numbness) in your legs.  You cannot control your pee or your poop (stool).  You have trouble breathing.  You have sudden pain in your chest.  You have chills or a fever. Summary  The prostate is a male gland that helps make semen. Prostate cancer is when abnormal cells grow in this gland.  Ask your doctor for help to find a support group for men with prostate cancer.  Contact a doctor if you have problems peeing or have any new pain that you did not have before. This information is not intended to replace advice given to you by your health care provider. Make sure you discuss any questions you have with your health care provider. Document Revised: 05/30/2017 Document Reviewed: 02/26/2016 Elsevier Patient Education  2020 Elsevier Inc.  

## 2020-06-27 DIAGNOSIS — Z880 Allergy status to penicillin: Secondary | ICD-10-CM | POA: Diagnosis not present

## 2020-06-27 DIAGNOSIS — Z8546 Personal history of malignant neoplasm of prostate: Secondary | ICD-10-CM | POA: Diagnosis not present

## 2020-06-27 DIAGNOSIS — Z809 Family history of malignant neoplasm, unspecified: Secondary | ICD-10-CM | POA: Diagnosis not present

## 2020-06-27 DIAGNOSIS — M1990 Unspecified osteoarthritis, unspecified site: Secondary | ICD-10-CM | POA: Diagnosis not present

## 2020-06-27 DIAGNOSIS — I10 Essential (primary) hypertension: Secondary | ICD-10-CM | POA: Diagnosis not present

## 2020-09-30 ENCOUNTER — Other Ambulatory Visit: Payer: Self-pay | Admitting: Internal Medicine

## 2020-09-30 NOTE — Telephone Encounter (Signed)
Please refill as per office routine med refill policy (all routine meds refilled for 3 mo or monthly per pt preference up to one year from last visit, then month to month grace period for 3 mo, then further med refills will have to be denied)  

## 2020-12-04 DIAGNOSIS — H66001 Acute suppurative otitis media without spontaneous rupture of ear drum, right ear: Secondary | ICD-10-CM | POA: Diagnosis not present

## 2020-12-06 ENCOUNTER — Ambulatory Visit (INDEPENDENT_AMBULATORY_CARE_PROVIDER_SITE_OTHER): Payer: Medicare HMO | Admitting: Urology

## 2020-12-06 ENCOUNTER — Encounter: Payer: Self-pay | Admitting: Urology

## 2020-12-06 VITALS — BP 123/74 | HR 64

## 2020-12-06 DIAGNOSIS — N401 Enlarged prostate with lower urinary tract symptoms: Secondary | ICD-10-CM

## 2020-12-06 DIAGNOSIS — C61 Malignant neoplasm of prostate: Secondary | ICD-10-CM | POA: Diagnosis not present

## 2020-12-06 DIAGNOSIS — R351 Nocturia: Secondary | ICD-10-CM | POA: Diagnosis not present

## 2020-12-06 DIAGNOSIS — N138 Other obstructive and reflux uropathy: Secondary | ICD-10-CM | POA: Diagnosis not present

## 2020-12-06 LAB — URINALYSIS, ROUTINE W REFLEX MICROSCOPIC
Bilirubin, UA: NEGATIVE
Glucose, UA: NEGATIVE
Ketones, UA: NEGATIVE
Leukocytes,UA: NEGATIVE
Nitrite, UA: NEGATIVE
Protein,UA: NEGATIVE
Specific Gravity, UA: >1.03 — ABNORMAL HIGH (ref 1.005–1.030)
Urobilinogen, Ur: 0.2 mg/dL (ref 0.2–1.0)
pH, UA: 6 (ref 5.0–7.5)

## 2020-12-06 LAB — MICROSCOPIC EXAMINATION
Bacteria, UA: NONE SEEN
Epithelial Cells (non renal): NONE SEEN /hpf (ref 0–10)
Renal Epithel, UA: NONE SEEN /hpf
WBC, UA: NONE SEEN /hpf (ref 0–5)

## 2020-12-06 LAB — BLADDER SCAN AMB NON-IMAGING: Scan Result: 0

## 2020-12-06 NOTE — Progress Notes (Signed)
12/06/2020 9:26 AM   Colin Ina Jan 28, 1951 [id number]  Referring provider: Biagio Borg, MD [place],  [place] [address]  followup BPH and Prostate Cancer  HPI: Mr Gahan is a 70yo here for followup for BPH and Prostate cancer. He did not get a PSA. He denies any significant LUTS. PVR 0cc. IPSS 5 QOL 2. He has mild urgency which does not bother him. No other complaints today   PMH: Past Medical History:  Diagnosis Date  . ANEMIA-NOS 07/08/2008  . ANXIETY 07/08/2008   Patient denies.  Marland Kitchen BENIGN PROSTATIC HYPERTROPHY 07/08/2008  . GERD 07/08/2008  . HYPERTENSION 07/08/2008  . INSOMNIA-SLEEP DISORDER-UNSPEC 07/08/2008  . 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 PERIPHERAL VASCULAR DISEASE 07/08/2008  . Prostate cancer ([place])   . PSA, INCREASED 07/08/2008  . Sleep apnea    ok if patient sleeps on his side. does not use CPAP    Surgical History: Past Surgical History:  Procedure Laterality Date  . COLONOSCOPY  multiple, last 03/08/2011   up until 2012: no adenomas 2012: 2 diminutive polyps  . ILIAC ARTERY ANEURYSM REPAIR  2007  . POLYPECTOMY    . RADIOACTIVE SEED IMPLANT N/A 06/29/2018   Procedure: RADIOACTIVE SEED IMPLANT/BRACHYTHERAPY IMPLANT;  Surgeon: Cleon Gustin, MD;  Location: [facility];  Service: Urology;  Laterality: N/A;  2 HRS  . SPACE OAR INSTILLATION N/A 06/29/2018   Procedure: SPACE OAR INSTILLATION;  Surgeon: Cleon Gustin, MD;  Location[facility];  Service: Urology;  Laterality: N/A;  . THORACOABDOMINAL AORTIC ANEURYSM REPAIR  1997  . TONSILLECTOMY    . UVULOPALATOPHARYNGOPLASTY  1988    Home Medications:  Allergies as of 12/06/2020      Reactions   Penicillins Hives      Medication List       Accurate as of December 06, 2020  9:26 AM. If you have any questions, ask your nurse or doctor.        amoxicillin 875 MG tablet Commonly known as: AMOXIL Take 875 mg by mouth 2 (two) times daily.   aspirin EC 81 MG  tablet Take 1 tablet (81 mg total) by mouth daily.   atenolol 50 MG tablet Commonly known as: TENORMIN TAKE 1 TABLET BY MOUTH EVERY DAY ANNUAL APPT DUE IN DEC MUST SEE PROVIDER FOR FUTURE REFILLS.   CALCIUM PLUS VITAMIN D PO Take by mouth daily.   COQ10 PO Take by mouth daily.   DHEA PO Take by mouth daily.   Fish Oil Oil by Does not apply route daily.   Glucosamine-Chondroitin-MSM 500-200-150 MG Tabs Take 1 tablet by mouth daily.   methylPREDNISolone 4 MG Tbpk tablet Commonly known as: MEDROL DOSEPAK Take by mouth.       Allergies:  Allergies  Allergen Reactions  . Penicillins Hives    Family History: Family History  Problem Relation Age of Onset  . Colon cancer Father 22  . Breast cancer Mother   . Esophageal cancer Neg Hx   . Rectal cancer Neg Hx   . Stomach cancer Neg Hx   . Pancreatic cancer Neg Hx   . Prostate cancer Neg Hx     Social History:  reports that he has never smoked. He has never used smokeless tobacco. He reports current alcohol use of about 2.0 standard drinks of alcohol per week. He reports that he does not use drugs.  ROS: All other review of systems were reviewed and are negative except  what is noted above in HPI  Physical Exam: BP 123/74   Pulse 64   Constitutional:  Alert and oriented, No acute distress. HEENT: [place] AT, moist mucus membranes.  Trachea midline, no masses. Cardiovascular: No clubbing, cyanosis, or edema. Respiratory: Normal respiratory effort, no increased work of breathing. GI: Abdomen is soft, nontender, nondistended, no abdominal masses GU: No CVA tenderness.  Lymph: No cervical or inguinal lymphadenopathy. Skin: No rashes, bruises or suspicious lesions. Neurologic: Grossly intact, no focal deficits, moving all 4 extremities. Psychiatric: Normal mood and affect.  Laboratory Data: Lab Results  Component Value Date   WBC 6.2 06/02/2020   HGB 15.6 06/02/2020   HCT 46.1 06/02/2020   MCV 83.6 06/02/2020   PLT  148.0 (L) 06/02/2020    Lab Results  Component Value Date   CREATININE 0.92 06/02/2020    Lab Results  Component Value Date   PSA 1.26 06/02/2019   PSA 7.03 (H) 01/21/2018   PSA 6.03 (H) 08/19/2017    No results found for: TESTOSTERONE  Lab Results  Component Value Date   HGBA1C 5.8 06/02/2020    Urinalysis    Component Value Date/Time   COLORURINE YELLOW 06/02/2020 0908   APPEARANCEUR Clear 06/07/2020 1649   LABSPEC >=1.030 (A) 06/02/2020 0908   PHURINE 6.0 06/02/2020 0908   GLUCOSEU Negative 06/07/2020 1649   GLUCOSEU NEGATIVE 06/02/2020 0908   HGBUR TRACE-INTACT (A) 06/02/2020 0908   BILIRUBINUR Negative 06/07/2020 1649   KETONESUR NEGATIVE 06/02/2020 0908   PROTEINUR Negative 06/07/2020 1649   UROBILINOGEN 0.2 06/02/2020 0908   NITRITE Negative 06/07/2020 1649   NITRITE NEGATIVE 06/02/2020 0908   LEUKOCYTESUR Negative 06/07/2020 1649   LEUKOCYTESUR NEGATIVE 06/02/2020 0908    Lab Results  Component Value Date   MUCUS Presence of (A) 06/02/2020    Pertinent Imaging:  No results found for this or any previous visit.  No results found for this or any previous visit.  No results found for this or any previous visit.  No results found for this or any previous visit.  No results found for this or any previous visit.  No results found for this or any previous visit.  No results found for this or any previous visit.  No results found for this or any previous visit.   Assessment & Plan:    1. Benign prostatic hyperplasia with urinary obstruction -Continue observation due to minimal bother - Urinalysis, Routine w reflex microscopic - BLADDER SCAN AMB NON-IMAGING  2. Nocturia -FLuid managemen  3. Prostate cancer -PSA today, will call with results -If stable, RTC 6 months with PSA   No follow-ups on file.  Nicolette Bang, MD  [facility] [place]

## 2020-12-06 NOTE — Patient Instructions (Signed)

## 2020-12-06 NOTE — Addendum Note (Signed)
Addended by: Cleon Gustin on: 12/06/2020 09:56 AM   Modules accepted: Orders

## 2020-12-06 NOTE — Progress Notes (Signed)
post void residual=0  Urological Symptom Review  Patient is experiencing the following symptoms: Hard to postpone urination Get up at night to urinate Leakage of urine   Review of Systems  Gastrointestinal (upper)  : Negative for upper GI symptoms  Gastrointestinal (lower) : Negative for lower GI symptoms  Constitutional : Negative for symptoms  Skin: Negative for skin symptoms  Eyes: Negative for eye symptoms  Ear/Nose/Throat : Negative for Ear/Nose/Throat symptoms  Hematologic/Lymphatic: Negative for Hematologic/Lymphatic symptoms  Cardiovascular : Negative for cardiovascular symptoms  Respiratory : Negative for respiratory symptoms  Endocrine: Negative for endocrine symptoms  Musculoskeletal: Joint pain  Neurological: Negative for neurological symptoms  Psychologic: Negative for psychiatric symptoms

## 2020-12-07 LAB — VITAMIN D 25 HYDROXY (VIT D DEFICIENCY, FRACTURES): Vit D, 25-Hydroxy: 37.8 ng/mL (ref 30.0–100.0)

## 2020-12-07 LAB — PSA: Prostate Specific Ag, Serum: 0.2 ng/mL (ref 0.0–4.0)

## 2020-12-18 NOTE — Progress Notes (Signed)
Sent via mail 

## 2020-12-26 NOTE — Progress Notes (Signed)
Results mailed 

## 2021-01-16 DIAGNOSIS — Z8546 Personal history of malignant neoplasm of prostate: Secondary | ICD-10-CM | POA: Diagnosis not present

## 2021-01-16 DIAGNOSIS — Z880 Allergy status to penicillin: Secondary | ICD-10-CM | POA: Diagnosis not present

## 2021-01-16 DIAGNOSIS — Z803 Family history of malignant neoplasm of breast: Secondary | ICD-10-CM | POA: Diagnosis not present

## 2021-01-16 DIAGNOSIS — I10 Essential (primary) hypertension: Secondary | ICD-10-CM | POA: Diagnosis not present

## 2021-01-16 DIAGNOSIS — Z008 Encounter for other general examination: Secondary | ICD-10-CM | POA: Diagnosis not present

## 2021-01-16 DIAGNOSIS — I2510 Atherosclerotic heart disease of native coronary artery without angina pectoris: Secondary | ICD-10-CM | POA: Diagnosis not present

## 2021-01-16 DIAGNOSIS — I719 Aortic aneurysm of unspecified site, without rupture: Secondary | ICD-10-CM | POA: Diagnosis not present

## 2021-01-16 DIAGNOSIS — G4730 Sleep apnea, unspecified: Secondary | ICD-10-CM | POA: Diagnosis not present

## 2021-01-16 DIAGNOSIS — R69 Illness, unspecified: Secondary | ICD-10-CM | POA: Diagnosis not present

## 2021-03-01 IMAGING — CT CT ANGIOGRAPHY ABDOMEN AND PELVIS WITH CONTRAST AND WITHOUT CONT
1 of 4 series · 12 of 32 positions shown, 17 images · IV contrast (APPLIED)
Comparison: CT 09/30/2013

CLINICAL DATA: History of thoracoabdominal aortic aneurysm and
dissection repair.

EXAM:
CT ANGIOGRAPHY ABDOMEN AND PELVIS WITH CONTRAST AND WITHOUT CONTRAST
TECHNIQUE: Multidetector CT imaging of the abdomen and pelvis was performed
using the standard protocol during bolus administration of
intravenous contrast. Multiplanar reconstructed images and MIPs were
obtained and reviewed to evaluate the vascular anatomy.
CONTRAST:  75mL GKCW5F-OK4 IOPAMIDOL (GKCW5F-OK4) INJECTION 76%

[Series 5: pre stent angio · axial · non-contrast · 0.86mm/px · z∈[-484,-70]mm · 12 of 470 slices shown, 17 images]
[im 28/470  soft-tissue]
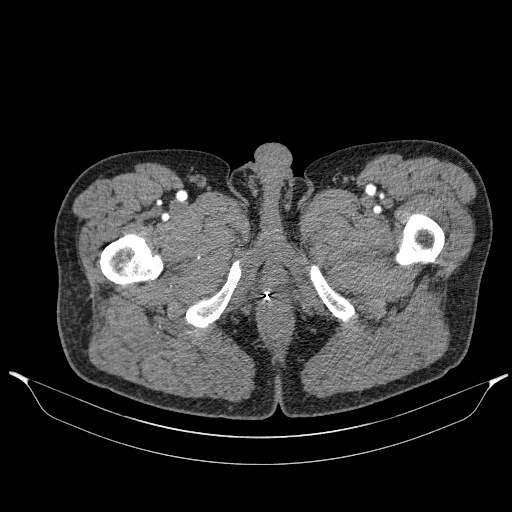
[im 28/470  bone]
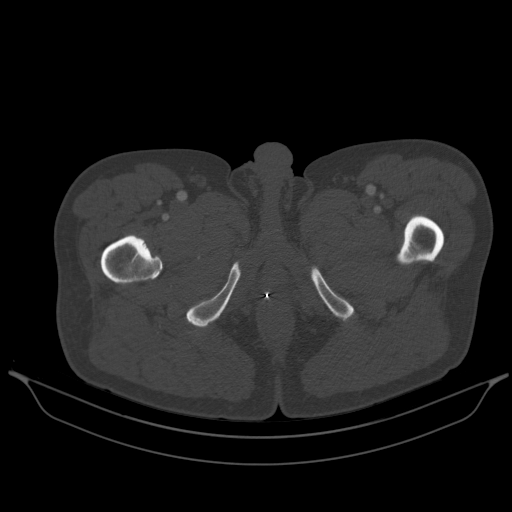
[im 83/470  soft-tissue]
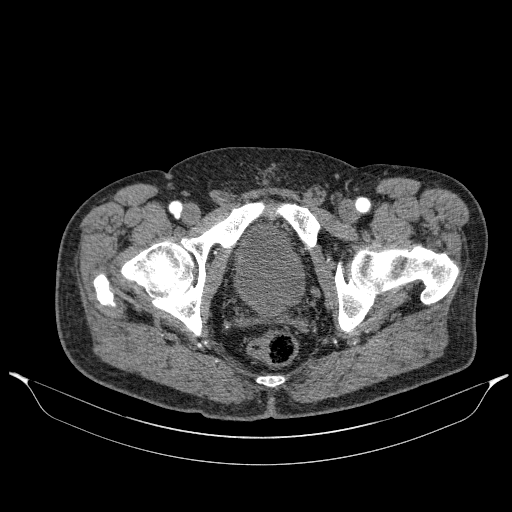
[im 111/470  soft-tissue]
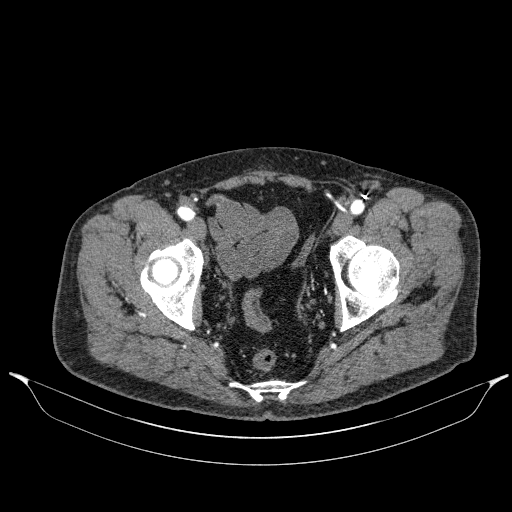
[im 166/470  soft-tissue]
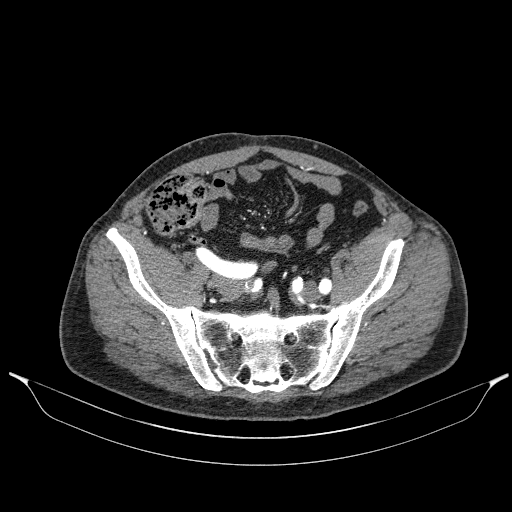
[im 194/470  soft-tissue]
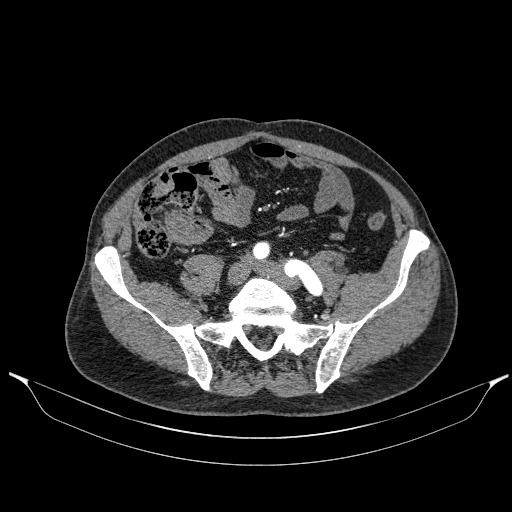
[im 249/470  soft-tissue]
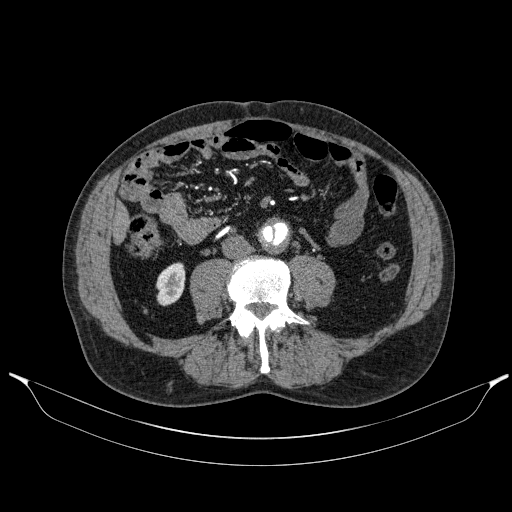
[im 276/470  soft-tissue]
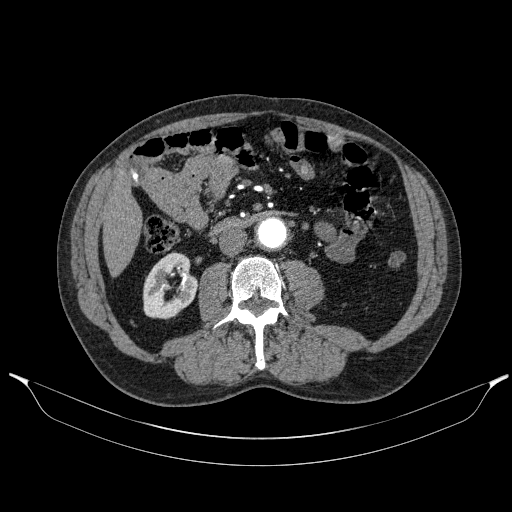
[im 304/470  soft-tissue]
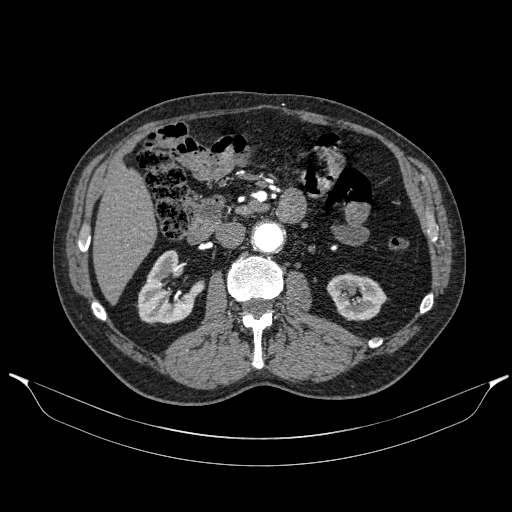
[im 359/470  soft-tissue]
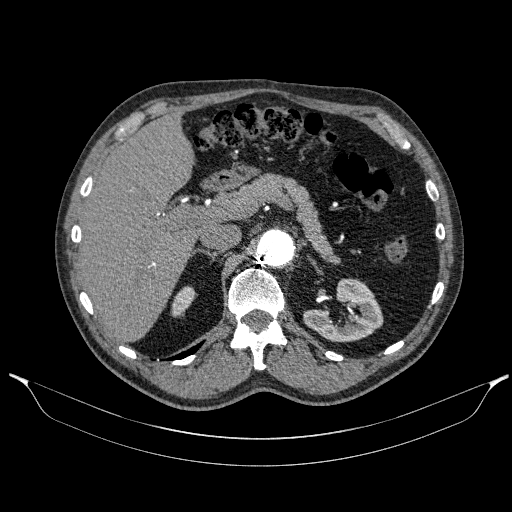
[im 359/470  lung]
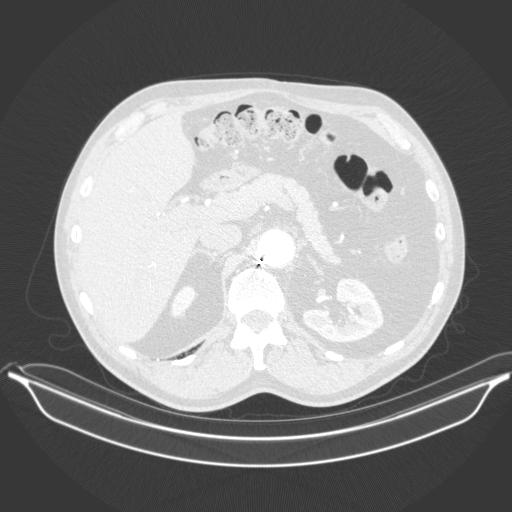
[im 359/470  bone]
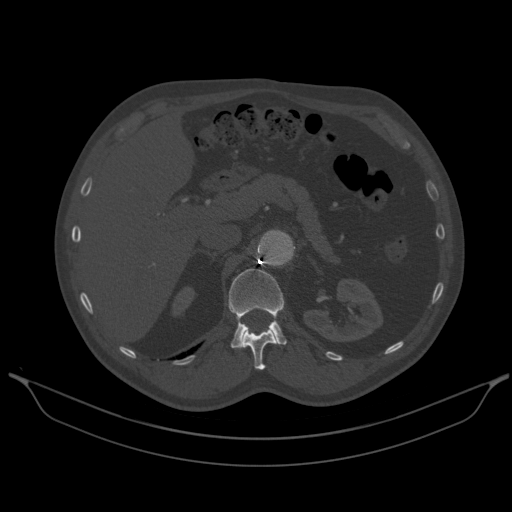
[im 387/470  soft-tissue]
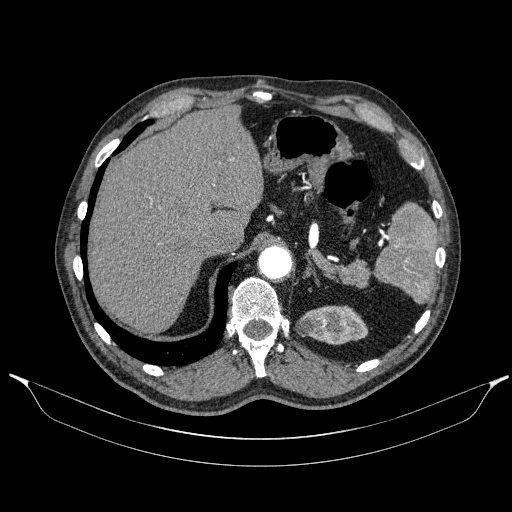
[im 387/470  lung]
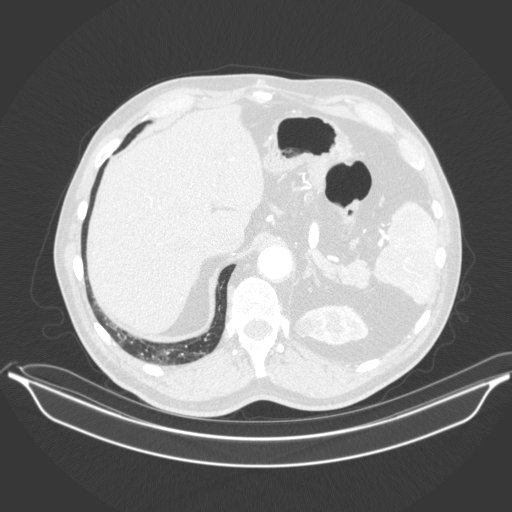
[im 414/470  lung]
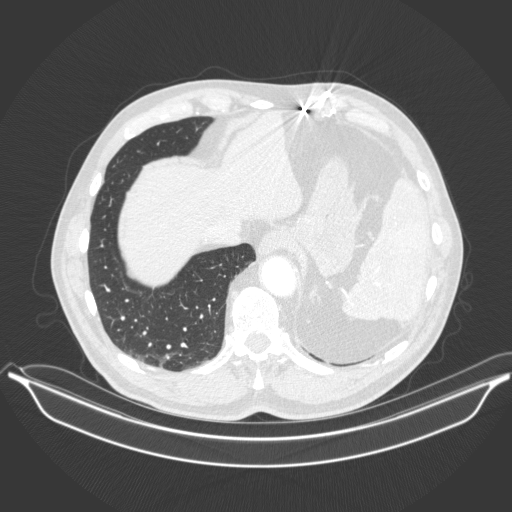
[im 442/470  soft-tissue]
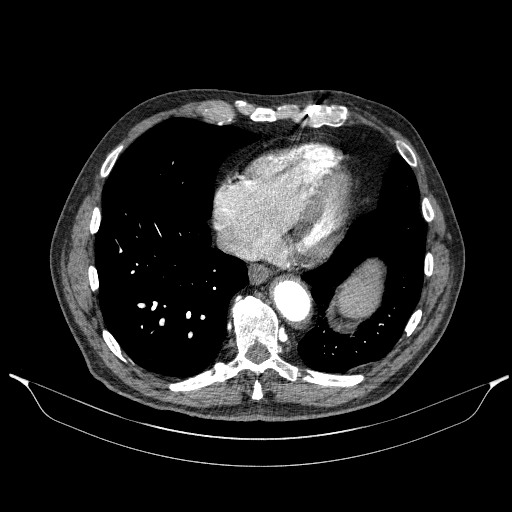
[im 442/470  lung]
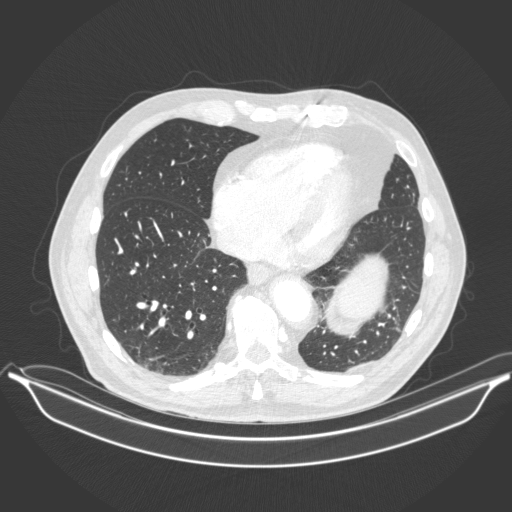

[12 of 32 positions shown; findings below may reference images not displayed]

FINDINGS: VASCULAR

Aorta: Distal descending thoracic aorta measures 3.5 cm and stable
with mild wall thickening or mural thrombus. Configuration of the
distal descending thoracic aorta is stable. Aorta at the level of
the hiatus measures 3.2 cm and stable. Abdominal aorta at the level
of the renal arteries measures 3.5 cm and stable. Evidence for
infrarenal abdominal aortic graft with bilateral limbs. Aortic graft
is patent. Stable appearance of the bilateral limbs.

Celiac: Celiac trunk is patent. The 2 main branches are the splenic
artery and left gastric artery.

SMA: SMA is patent with a replaced common hepatic artery.

Renals: Left renal artery is widely patent. Right renal artery is
patent. Again noted is a small intimal flap or web near the origin
which is similar to the previous examination.

IMA: Origin of the IMA is occluded. Distal reconstitution of the
IMA.

Inflow: Aortoiliac graft ties into the common iliac arteries
bilaterally. Mild stranding near the right iliac bifurcation
probably related to the anastomosis site and this is similar to the
previous examination. Mixed plaque involving the proximal right
internal iliac artery with stenosis and findings are similar to the
previous examination. Right internal iliac artery is ectatic
measuring up to 1.3 cm. Proximal right external iliac artery
measures 1.6 cm and stable. Mild tortuosity of the proximal left
limb graft is stable. Left internal and external iliac arteries are
patent.

Proximal Outflow: Proximal femoral arteries are patent bilaterally.

Veins: Main portal vein is patent. IVC and renal veins are patent.
Common iliac veins are patent.

Review of the MIP images confirms the above findings.

NON-VASCULAR

Lower chest: Punctate calcification in the posterior right lower
chest is most compatible with calcified granuloma. Otherwise, the
lung bases are clear. Surgical wires in the left anterior chest
wall.

Hepatobiliary: Calcified gallstones. Small hypodensity in the liver
near the gallbladder is too small to definitively characterize but
likely an incidental finding. No evidence for biliary dilatation.

Pancreas: Unremarkable. No pancreatic ductal dilatation or
surrounding inflammatory changes.

Spleen: Normal in size without focal abnormality.

Adrenals/Urinary Tract: Normal appearance of the adrenal glands.
Stable low-density structure in left kidney upper pole is most
compatible with a cyst measuring 1.5 cm. Probable additional cyst in
the anterior left kidney lower pole region. No suspicious renal
lesions. Urinary bladder is unremarkable.

Stomach/Bowel: Normal appearance of the stomach and duodenum.
Hyperdense intraluminal structures with the small bowel loops in the
right abdomen. Findings are compatible with ingested material. No
inflammatory changes around these hyperdense intraluminal
structures. Normal appendix. No evidence for bowel obstruction or
bowel inflammation.

Lymphatic: No abdominopelvic lymphadenopathy.

Reproductive: Brachytherapy seeds throughout the prostate.

Other: Negative for free fluid. Negative for free air. Multiple
surgical clips scattered throughout the left upper abdomen.

Musculoskeletal: Disc space narrowing at L4-L5. No suspicious bone
findings.
IMPRESSION: VASCULAR

1. Stable appearance of the abdominal aorta and aortoiliac graft.
2. Juxtarenal abdominal aortic aneurysm measuring 3.5 cm and stable.
3. Probable vascular web at the origin of the right renal artery is
unchanged.

NON-VASCULAR

1. No acute abnormality in the abdomen or pelvis.
2. Cholelithiasis.

## 2021-04-12 ENCOUNTER — Other Ambulatory Visit: Payer: Self-pay | Admitting: Internal Medicine

## 2021-04-12 NOTE — Telephone Encounter (Signed)
Please refill as per office routine med refill policy (all routine meds to be refilled for 3 mo or monthly (per pt preference) up to one year from last visit, then month to month grace period for 3 mo, then further med refills will have to be denied) ? ?

## 2021-05-30 ENCOUNTER — Other Ambulatory Visit: Payer: Self-pay

## 2021-05-30 ENCOUNTER — Other Ambulatory Visit: Payer: Medicare HMO

## 2021-05-30 DIAGNOSIS — C61 Malignant neoplasm of prostate: Secondary | ICD-10-CM | POA: Diagnosis not present

## 2021-05-31 LAB — PSA: Prostate Specific Ag, Serum: 0.1 ng/mL (ref 0.0–4.0)

## 2021-06-05 ENCOUNTER — Encounter: Payer: Medicare HMO | Admitting: Internal Medicine

## 2021-06-06 ENCOUNTER — Encounter: Payer: Self-pay | Admitting: Urology

## 2021-06-06 ENCOUNTER — Other Ambulatory Visit: Payer: Self-pay

## 2021-06-06 ENCOUNTER — Ambulatory Visit: Payer: Medicare HMO | Admitting: Urology

## 2021-06-06 VITALS — BP 115/76 | HR 59

## 2021-06-06 DIAGNOSIS — R351 Nocturia: Secondary | ICD-10-CM

## 2021-06-06 DIAGNOSIS — N401 Enlarged prostate with lower urinary tract symptoms: Secondary | ICD-10-CM

## 2021-06-06 DIAGNOSIS — N138 Other obstructive and reflux uropathy: Secondary | ICD-10-CM | POA: Diagnosis not present

## 2021-06-06 DIAGNOSIS — C61 Malignant neoplasm of prostate: Secondary | ICD-10-CM | POA: Diagnosis not present

## 2021-06-06 LAB — MICROSCOPIC EXAMINATION
Bacteria, UA: NONE SEEN
Epithelial Cells (non renal): NONE SEEN /hpf (ref 0–10)
Renal Epithel, UA: NONE SEEN /hpf
WBC, UA: NONE SEEN /hpf (ref 0–5)

## 2021-06-06 LAB — URINALYSIS, ROUTINE W REFLEX MICROSCOPIC
Bilirubin, UA: NEGATIVE
Glucose, UA: NEGATIVE
Ketones, UA: NEGATIVE
Leukocytes,UA: NEGATIVE
Nitrite, UA: NEGATIVE
Protein,UA: NEGATIVE
Specific Gravity, UA: 1.025 (ref 1.005–1.030)
Urobilinogen, Ur: 0.2 mg/dL (ref 0.2–1.0)
pH, UA: 6 (ref 5.0–7.5)

## 2021-06-06 NOTE — Patient Instructions (Signed)
Prostate Cancer °The prostate is a small gland that helps make semen. It is located below a man's bladder, in front of the rectum. Prostate cancer is when abnormal cells grow in this gland. °What are the causes? °The cause of this condition is not known. °What increases the risk? °Being age 69 or older. °Having a family history of prostate cancer. °Having a family history of cancer of the breasts or ovaries. °Having genes that are passed from parent to child (inherited). °Having Lynch syndrome. °African American men and men of African descent are diagnosed with prostate cancer at higher rates than other men. °What are the signs or symptoms? °Problems peeing (urinating). This may include: °A stream that is weak, or pee that stops and starts. °Trouble starting or stopping your pee. °Trouble emptying all of your pee. °Needing to pee more often, especially at night. °Blood in your pee or semen. °Pain in the: °Lower back. °Lower belly (abdomen). °Hips. °Trouble getting an erection. °Weakness or numbness in the legs or feet. °How is this treated? °Treatment for this condition depends on: °How much the cancer has spread. °Your age. °The kind of treatment you want. °Your health. °Treatments include: °Being watched. This is called observation. You will be tested from time to time, but you will not get treated. Tests are to make sure that the cancer is not growing. °Surgery. This may be done to: °Take out (remove) the prostate. °Freeze and kill cancer cells. °Radiation. This uses a strong beam of energy to kill cancer cells. °Chemotherapy. This uses medicines that stop cancer cells from increasing. This kills cancer cells and healthy cells. °Targeted therapy. This kills cancer cells only. Healthy cells are not affected. °Hormone treatment. This stops the body from making hormones that help the cancer cells grow. °Follow these instructions at home: °Lifestyle °Do not smoke or use any products that contain nicotine or tobacco.  If you need help quitting, ask your doctor. °Eat a healthy diet. °Treatment may affect your ability to have sex. If you have a partner, touch, hold, hug, and caress your partner to have intimate moments. °Get plenty of sleep. °Ask your doctor for help to find a support group for men with prostate cancer. °General instructions °Take over-the-counter and prescription medicines only as told by your doctor. °If you have to go to the hospital, let your cancer doctor (oncologist) know. °Keep all follow-up visits. °Where to find more information °American Cancer Society: www.cancer.org °American Society of Clinical Oncology: www.cancer.net °National Cancer Institute: www.cancer.gov °Contact a doctor if: °You have new or more trouble peeing. °You have new or more blood in your pee. °You have new or more pain in your hips, back, or chest. °Get help right away if: °You have weakness in your legs. °You lose feeling in your legs. °You cannot control your pee or your poop (stool). °You have chills or a fever. °Summary °The prostate is a male gland that helps make semen. °Prostate cancer is when abnormal cells grow in this gland. °Treatment includes doing surgery, using medicines, using strong beams of energy, or watching without treatment. °Ask your doctor for help to find a support group for men with prostate cancer. °Contact a doctor if you have problems peeing or have any new pain that you did not have before. °This information is not intended to replace advice given to you by your health care provider. Make sure you discuss any questions you have with your health care provider. °Document Revised: 09/13/2020 Document Reviewed: 09/13/2020 °Elsevier   Patient Education © 2022 [employer]vier Inc. ° °

## 2021-06-06 NOTE — Progress Notes (Signed)
Urological Symptom Review  Patient is experiencing the following symptoms: Frequent urination Hard to postpone urination Get up at night to urinate   Review of Systems  Gastrointestinal (upper)  : Indigestion/heartburn  Gastrointestinal (lower) : Negative for lower GI symptoms  Constitutional : Negative for symptoms  Skin: Negative for skin symptoms  Eyes: Negative for eye symptoms  Ear/Nose/Throat : Negative for Ear/Nose/Throat symptoms  Hematologic/Lymphatic: Negative for Hematologic/Lymphatic symptoms  Cardiovascular : Negative for cardiovascular symptoms  Respiratory : Negative for respiratory symptoms  Endocrine: Negative for endocrine symptoms  Musculoskeletal: Joint pain  Neurological: Negative for neurological symptoms  Psychologic: Negative for psychiatric symptoms

## 2021-06-06 NOTE — Progress Notes (Signed)
06/06/2021 10:04 AM   James Mullen 13-May-1951 [id number]  Referring provider: Biagio Borg, MD [place],   [id number]  Followup prostate cancer and BPH   HPI: James Mullen is a 70yo here for followup for prostate cancer and BPH. PSA decreased to to 0.1 from 0.2. IPSS 7 QOL 2. Nocturia 2x depending on fluid management. No other complaints today.    PMH: Past Medical History:  Diagnosis Date   ANEMIA-NOS 07/08/2008   ANXIETY 07/08/2008   Patient denies.   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 does not use CPAP    Surgical History: Past Surgical History:  Procedure Laterality Date   COLONOSCOPY  multiple, last 03/08/2011   up until 2012: no adenomas 2012: 2 diminutive polyps   ILIAC ARTERY ANEURYSM REPAIR  2007   POLYPECTOMY     RADIOACTIVE SEED IMPLANT N/A 06/29/2018   Procedure: RADIOACTIVE SEED IMPLANT/BRACHYTHERAPY IMPLANT;  Surgeon: Cleon Gustin, MD;  Location[facility];  Service: Urology;  Laterality: N/A;  2 HRS   SPACE OAR INSTILLATION N/A 06/29/2018   Procedure: SPACE OAR INSTILLATION;  Surgeon: Cleon Gustin, MD;  Location: [facility];  Service: Urology;  Laterality: N/A;   THORACOABDOMINAL AORTIC ANEURYSM REPAIR  1997   TONSILLECTOMY     UVULOPALATOPHARYNGOPLASTY  1988    Home Medications:  Allergies as of 06/06/2021       Reactions   Penicillins Hives        Medication List        Accurate as of June 06, 2021 10:04 AM. If you have any questions, ask your nurse or doctor.          STOP taking these medications    amoxicillin 875 MG tablet Commonly known as: AMOXIL Stopped by: Nicolette Bang, MD   atenolol 50 MG  tablet Commonly known as: TENORMIN Stopped by: Nicolette Bang, MD   Glucosamine-Chondroitin-MSM [phone] MG Tabs Stopped by: Nicolette Bang, MD   methylPREDNISolone 4 MG Tbpk tablet Commonly known as: MEDROL DOSEPAK Stopped by: Nicolette Bang, MD       TAKE these medications    aspirin EC 81 MG tablet Take 1 tablet (81 mg total) by mouth daily.   CALCIUM PLUS VITAMIN D PO Take by mouth daily.   COQ10 PO Take by mouth daily.   DHEA PO Take by mouth daily.   Fish Oil Oil by Does not apply route daily.        Allergies:  Allergies  Allergen Reactions   Penicillins Hives    Family History: Family History  Problem Relation Age of Onset   Colon cancer Father 22   Breast cancer Mother    Esophageal cancer Neg Hx    Rectal cancer Neg Hx    Stomach cancer Neg Hx    Pancreatic cancer Neg Hx    Prostate cancer Neg Hx     Social History:  reports that he has never smoked. He has never used smokeless tobacco. He reports current alcohol use of about 2.0 standard drinks per week. He reports that he does not use drugs.  ROS: All other review of systems were reviewed and are negative except what is  noted above in HPI  Physical Exam: BP 115/76   Pulse (!) 59   Constitutional:  Alert and oriented, No acute distress. HEENT: [place] AT, moist mucus membranes.  Trachea midline, no masses. Cardiovascular: No clubbing, cyanosis, or edema. Respiratory: Normal respiratory effort, no increased work of breathing. GI: Abdomen is soft, nontender, nondistended, no abdominal masses GU: No CVA tenderness.  Lymph: No cervical or inguinal lymphadenopathy. Skin: No rashes, bruises or suspicious lesions. Neurologic: Grossly intact, no focal deficits, moving all 4 extremities. Psychiatric: Normal mood and affect.  Laboratory Data: Lab Results  Component Value Date   WBC 6.2 06/02/2020   HGB 15.6 06/02/2020   HCT 46.1 06/02/2020   MCV 83.6 06/02/2020   PLT 148.0 (L)  06/02/2020    Lab Results  Component Value Date   CREATININE 0.92 06/02/2020    Lab Results  Component Value Date   PSA 1.26 06/02/2019   PSA 7.03 (H) 01/21/2018   PSA 6.03 (H) 08/19/2017    No results found for: TESTOSTERONE  Lab Results  Component Value Date   HGBA1C 5.8 06/02/2020    Urinalysis    Component Value Date/Time   COLORURINE YELLOW 06/02/2020 0908   APPEARANCEUR Clear 12/06/2020 0910   LABSPEC >=1.030 (A) 06/02/2020 0908   PHURINE 6.0 06/02/2020 0908   GLUCOSEU Negative 12/06/2020 0910   GLUCOSEU NEGATIVE 06/02/2020 0908   HGBUR TRACE-INTACT (A) 06/02/2020 0908   BILIRUBINUR Negative 12/06/2020 0910   KETONESUR NEGATIVE 06/02/2020 0908   PROTEINUR Negative 12/06/2020 0910   UROBILINOGEN 0.2 06/02/2020 0908   NITRITE Negative 12/06/2020 0910   NITRITE NEGATIVE 06/02/2020 0908   LEUKOCYTESUR Negative 12/06/2020 0910   LEUKOCYTESUR NEGATIVE 06/02/2020 0908    Lab Results  Component Value Date   LABMICR See below: 12/06/2020   WBCUA None seen 12/06/2020   LABEPIT None seen 12/06/2020   MUCUS Presence of (A) 06/02/2020   BACTERIA None seen 12/06/2020    Pertinent Imaging:  No results found for this or any previous visit.  No results found for this or any previous visit.  No results found for this or any previous visit.  No results found for this or any previous visit.  No results found for this or any previous visit.  No results found for this or any previous visit.  No results found for this or any previous visit.  No results found for this or any previous visit.   Assessment & Plan:    1. Benign prostatic hyperplasia with urinary obstruction -patient defers therapy at this time - Urinalysis, Routine w reflex microscopic  2. Prostate cancer ([place]) -RTC 6 months with PSA  3. Nocturia -Fluid management prior to going to bed.    No follow-ups on file.  Nicolette Bang, MD  [facility] [place]

## 2021-06-12 NOTE — Progress Notes (Signed)
Sent via mail 

## 2021-11-20 ENCOUNTER — Encounter: Payer: Self-pay | Admitting: Internal Medicine

## 2021-12-05 ENCOUNTER — Other Ambulatory Visit: Payer: Medicare HMO

## 2021-12-12 ENCOUNTER — Ambulatory Visit: Payer: Medicare HMO | Admitting: Urology

## 2021-12-12 ENCOUNTER — Other Ambulatory Visit: Payer: Medicare HMO

## 2021-12-19 ENCOUNTER — Ambulatory Visit: Payer: Medicare HMO | Admitting: Urology

## 2022-08-08 ENCOUNTER — Telehealth: Payer: Self-pay | Admitting: Internal Medicine

## 2022-08-08 NOTE — Telephone Encounter (Signed)
Med not listed, patient will need an office visit for any refills

## 2022-08-08 NOTE — Telephone Encounter (Signed)
Patient called asking for a refill on his atenolol '50mg'$ , but it looks as if it was taken off of his medication list, is the patient to stop taking it ? Patient would like a callback at [phone].

## 2022-11-19 ENCOUNTER — Encounter: Payer: Self-pay | Admitting: Internal Medicine

## 2022-11-19 ENCOUNTER — Ambulatory Visit (INDEPENDENT_AMBULATORY_CARE_PROVIDER_SITE_OTHER): Payer: Medicare HMO | Admitting: Internal Medicine

## 2022-11-19 VITALS — BP 120/78 | HR 81 | Temp 97.8°F | Ht 74.0 in | Wt 193.0 lb

## 2022-11-19 DIAGNOSIS — I10 Essential (primary) hypertension: Secondary | ICD-10-CM

## 2022-11-19 DIAGNOSIS — I7121 Aneurysm of the ascending aorta, without rupture: Secondary | ICD-10-CM | POA: Diagnosis not present

## 2022-11-19 DIAGNOSIS — F419 Anxiety disorder, unspecified: Secondary | ICD-10-CM

## 2022-11-19 DIAGNOSIS — E538 Deficiency of other specified B group vitamins: Secondary | ICD-10-CM

## 2022-11-19 DIAGNOSIS — Z86010 Personal history of colonic polyps: Secondary | ICD-10-CM

## 2022-11-19 DIAGNOSIS — I716 Thoracoabdominal aortic aneurysm, without rupture, unspecified: Secondary | ICD-10-CM | POA: Diagnosis not present

## 2022-11-19 DIAGNOSIS — E559 Vitamin D deficiency, unspecified: Secondary | ICD-10-CM

## 2022-11-19 DIAGNOSIS — R739 Hyperglycemia, unspecified: Secondary | ICD-10-CM

## 2022-11-19 DIAGNOSIS — Z23 Encounter for immunization: Secondary | ICD-10-CM

## 2022-11-19 DIAGNOSIS — Z0001 Encounter for general adult medical examination with abnormal findings: Secondary | ICD-10-CM | POA: Diagnosis not present

## 2022-11-19 DIAGNOSIS — F32A Depression, unspecified: Secondary | ICD-10-CM

## 2022-11-19 DIAGNOSIS — C61 Malignant neoplasm of prostate: Secondary | ICD-10-CM | POA: Diagnosis not present

## 2022-11-19 LAB — CBC WITH DIFFERENTIAL/PLATELET
Basophils Absolute: 0 10*3/uL (ref 0.0–0.1)
Basophils Relative: 0.8 % (ref 0.0–3.0)
Eosinophils Absolute: 0.1 10*3/uL (ref 0.0–0.7)
Eosinophils Relative: 1.7 % (ref 0.0–5.0)
HCT: 47.6 % (ref 39.0–52.0)
Hemoglobin: 16 g/dL (ref 13.0–17.0)
Lymphocytes Relative: 32.9 % (ref 12.0–46.0)
Lymphs Abs: 1.5 10*3/uL (ref 0.7–4.0)
MCHC: 33.7 g/dL (ref 30.0–36.0)
MCV: 84.5 fl (ref 78.0–100.0)
Monocytes Absolute: 0.5 10*3/uL (ref 0.1–1.0)
Monocytes Relative: 10.5 % (ref 3.0–12.0)
Neutro Abs: 2.5 10*3/uL (ref 1.4–7.7)
Neutrophils Relative %: 54.1 % (ref 43.0–77.0)
Platelets: 152 10*3/uL (ref 150.0–400.0)
RBC: 5.63 Mil/uL (ref 4.22–5.81)
RDW: 13.7 % (ref 11.5–15.5)
WBC: 4.6 10*3/uL (ref 4.0–10.5)

## 2022-11-19 LAB — URINALYSIS, ROUTINE W REFLEX MICROSCOPIC
Bilirubin Urine: NEGATIVE
Ketones, ur: NEGATIVE
Leukocytes,Ua: NEGATIVE
Nitrite: NEGATIVE
RBC / HPF: NONE SEEN (ref 0–?)
Specific Gravity, Urine: >=1.03 — AB (ref 1.000–1.030)
Total Protein, Urine: NEGATIVE
Urine Glucose: NEGATIVE
Urobilinogen, UA: 0.2 (ref 0.0–1.0)
pH: 6 (ref 5.0–8.0)

## 2022-11-19 LAB — HEPATIC FUNCTION PANEL
ALT: 15 U/L (ref 0–53)
AST: 16 U/L (ref 0–37)
Albumin: 4.2 g/dL (ref 3.5–5.2)
Alkaline Phosphatase: 73 U/L (ref 39–117)
Bilirubin, Direct: 0.2 mg/dL (ref 0.0–0.3)
Total Bilirubin: 0.6 mg/dL (ref 0.2–1.2)
Total Protein: 6.7 g/dL (ref 6.0–8.3)

## 2022-11-19 LAB — LIPID PANEL
Cholesterol: 152 mg/dL (ref 0–200)
HDL: 47.3 mg/dL (ref >39.00)
LDL Cholesterol: 91 mg/dL (ref 0–99)
NonHDL: 104.24
Total CHOL/HDL Ratio: 3
Triglycerides: 67 mg/dL (ref 0.0–149.0)
VLDL: 13.4 mg/dL (ref 0.0–40.0)

## 2022-11-19 LAB — BASIC METABOLIC PANEL
BUN: 12 mg/dL (ref 6–23)
CO2: 26 mEq/L (ref 19–32)
Calcium: 9.2 mg/dL (ref 8.4–10.5)
Chloride: 104 mEq/L (ref 96–112)
Creatinine, Ser: 0.86 mg/dL (ref 0.40–1.50)
GFR: 87.54 mL/min (ref >60.00)
Glucose, Bld: 96 mg/dL (ref 70–99)
Potassium: 4.1 mEq/L (ref 3.5–5.1)
Sodium: 142 mEq/L (ref 135–145)

## 2022-11-19 LAB — TSH: TSH: 2.07 u[IU]/mL (ref 0.35–5.50)

## 2022-11-19 LAB — VITAMIN B12: Vitamin B-12: 238 pg/mL (ref 211–911)

## 2022-11-19 LAB — HEMOGLOBIN A1C: Hgb A1c MFr Bld: 5.8 % (ref 4.6–6.5)

## 2022-11-19 LAB — MICROALBUMIN / CREATININE URINE RATIO
Creatinine,U: 176.5 mg/dL
Microalb Creat Ratio: 0.7 mg/g (ref 0.0–30.0)
Microalb, Ur: 1.3 mg/dL (ref 0.0–1.9)

## 2022-11-19 LAB — VITAMIN D 25 HYDROXY (VIT D DEFICIENCY, FRACTURES): VITD: 57 ng/mL (ref 30.00–100.00)

## 2022-11-19 LAB — PSA: PSA: 0.03 ng/mL — ABNORMAL LOW (ref 0.10–4.00)

## 2022-11-19 MED ORDER — ATENOLOL 25 MG PO TABS: 25.0000 mg | ORAL_TABLET | Freq: Every day | ORAL | 3 refills | Status: DC

## 2022-11-19 NOTE — Progress Notes (Unsigned)
Patient ID: James Mullen, male   DOB: 19-Jun-1951, 72 y.o.   MRN: [id number]         Chief Complaint:: wellness exam and ascending aortic aneurysm, prostate ca, anxiety, depresison, htn, AAA, hyperglycemia, low vit d       HPI:  James Mullen is a 72 y.o. male here for wellness exam; for shingrix at pharmacy, for colonoscopy as is now due, for prevnar 20 , o/w up to date                        Also has chronic insomnia he takes otc preps only, getting to sleep still an issue though now retired. Pt denies chest pain, increased sob or doe, wheezing, orthopnea, PND, increased LE swelling, palpitations, dizziness or syncope.   Pt denies polydipsia, polyuria, or new focal neuro s/s.    Pt denies fever, wt loss, night sweats, loss of appetite, or other constitutional symptoms     Wt Readings from Last 3 Encounters:  11/19/22 193 lb (87.5 kg)  06/07/20 199 lb (90.3 kg)  06/02/20 192 lb (87.1 kg)   BP Readings from Last 3 Encounters:  11/19/22 120/78  06/06/21 115/76  12/06/20 123/74   Immunization History  Administered Date(s) Administered   PNEUMOCOCCAL CONJUGATE-20 11/19/2022   Td 07/08/2008   Tdap 11/29/2012   Health Maintenance Due  Topic Date Due   Medicare Annual Wellness (AWV)  Never done   Zoster Vaccines- Shingrix (1 of 2) Never done   COLONOSCOPY (Pts 45-47yrs Insurance coverage will need to be confirmed)  10/01/2021      Past Medical History:  Diagnosis Date   ANEMIA-NOS 07/08/2008   ANXIETY 07/08/2008   Patient denies.   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 does not use CPAP   Past Surgical History:  Procedure Laterality Date   COLONOSCOPY  multiple, last 03/08/2011   up until 2012: no adenomas 2012: 2 diminutive  polyps   ILIAC ARTERY ANEURYSM REPAIR  2007   POLYPECTOMY     RADIOACTIVE SEED IMPLANT N/A 06/29/2018   Procedure: RADIOACTIVE SEED IMPLANT/BRACHYTHERAPY IMPLANT;  Surgeon: Malen Gauze, MD;  Location: [facility];  Service: Urology;  Laterality: N/A;  2 HRS   SPACE OAR INSTILLATION N/A 06/29/2018   Procedure: SPACE OAR INSTILLATION;  Surgeon: Malen Gauze, MD;  [facility];  Service: Urology;  Laterality: N/A;   THORACOABDOMINAL AORTIC ANEURYSM REPAIR  1997   TONSILLECTOMY     UVULOPALATOPHARYNGOPLASTY  1988    reports that he has never smoked. He has never used smokeless tobacco. He reports current alcohol use of about 2.0 standard drinks of alcohol per week. He reports that he does not use drugs. family history includes Breast cancer in his mother; Colon cancer (age of onset: 39) in his father. Allergies  Allergen Reactions   Penicillins Hives   Current Outpatient Medications on File Prior to Visit  Medication Sig Dispense Refill   aspirin EC 81 MG tablet Take 1 tablet (81 mg total) by mouth daily. 150 tablet 2   Calcium Carbonate-Vitamin D (CALCIUM PLUS VITAMIN D PO) Take by mouth daily.     Coenzyme Q10 (COQ10  PO) Take by mouth daily.     Fish Oil OIL by Does not apply route daily.     Nutritional Supplements (DHEA PO) Take by mouth daily.     No current facility-administered medications on file prior to visit.        ROS:  All others reviewed and negative.  Objective        PE:  BP 120/78 (BP Location: Right Arm, Patient Position: Sitting, Cuff Size: Normal)   Pulse 81   Temp 97.8 F (36.6 C) (Oral)   Ht 6\' 2"  (1.88 m)   Wt 193 lb (87.5 kg)   SpO2 98%   BMI 24.78 kg/m                 Constitutional: Pt appears in NAD               HENT: Head: NCAT.                Right Ear: External ear normal.                 Left Ear: External ear normal.                Eyes: . Pupils are equal, round, and reactive to light.  Conjunctivae and EOM are normal               Nose: without d/c or deformity               Neck: Neck supple. Gross normal ROM               Cardiovascular: Normal rate and regular rhythm.                 Pulmonary/Chest: Effort normal and breath sounds without rales or wheezing.                Abd:  Soft, NT, ND, + BS, no organomegaly               Neurological: Pt is alert. At baseline orientation, motor grossly intact               Skin: Skin is warm. No rashes, no other new lesions, LE edema - none               Psychiatric: Pt behavior is normal without agitation   Micro: none  Cardiac tracings I have personally interpreted today:  none  Pertinent Radiological findings (summarize): none   Lab Results  Component Value Date   WBC 4.6 11/19/2022   HGB 16.0 11/19/2022   HCT 47.6 11/19/2022   PLT 152.0 11/19/2022   GLUCOSE 96 11/19/2022   CHOL 152 11/19/2022   TRIG 67.0 11/19/2022   HDL 47.30 11/19/2022   LDLDIRECT 98.0 11/27/2016   LDLCALC 91 11/19/2022   ALT 15 11/19/2022   AST 16 11/19/2022   NA 142 11/19/2022   K 4.1 11/19/2022   CL 104 11/19/2022   CREATININE 0.86 11/19/2022   BUN 12 11/19/2022   CO2 26 11/19/2022   TSH 2.07 11/19/2022   PSA 0.03 (L) 11/19/2022   INR 0.97 06/22/2018   HGBA1C 5.8 11/19/2022   MICROALBUR 1.3 11/19/2022   Assessment/Plan:  James Mullen is a 72 y.o. White or Caucasian [1] male with  has a past medical history of ANEMIA-NOS (07/08/2008), ANXIETY (07/08/2008), BENIGN PROSTATIC HYPERTROPHY (07/08/2008), GERD (07/08/2008), HYPERTENSION (07/08/2008), INSOMNIA-SLEEP DISORDER-UNSPEC (07/08/2008), Left knee DJD (12/25/2010), PERIPHERAL VASCULAR DISEASE (07/08/2008), Prostate cancer (  HCC), PSA, INCREASED (07/08/2008), and Sleep apnea.  Encounter for well adult exam with abnormal findings Age and sex appropriate education and counseling updated with regular exercise and diet Referrals for preventative services - for colonoscopy Immunizations addressed -  for prevnar 20 today, for shingrix at pharmacy Smoking counseling  - none needed Evidence for depression or other mood disorder - none significant Most recent labs reviewed. I have personally reviewed and have noted: 1) the patient's medical and social history 2) The patient's current medications and supplements 3) The patient's height, weight, and BMI have been recorded in the chart   Prostate cancer (HCC) Stable, for f/u psa  Anxiety and depression Stable, denies need for change in tx or referral for counseling  Essential hypertension BP Readings from Last 3 Encounters:  11/19/22 120/78  06/06/21 115/76  12/06/20 123/74   Stable, pt to continue medical treatment tenormin 25 qd,    Thoracoabdominal aneurysm Due for f/u CTA aortic, restart atenolol, and refer vascular as has been lost to f/u  Hyperglycemia Lab Results  Component Value Date   HGBA1C 5.8 11/19/2022   Stable, pt to continue current medical treatment  - diet, wt control   Vitamin D deficiency Last vitamin D Lab Results  Component Value Date   VD25OH 57.00 11/19/2022   Stable, cont oral replacement  Followup: Return in about 6 months (around 05/22/2023).  Oliver Barre, MD 11/20/2022 9:13 PM [facility]

## 2022-11-19 NOTE — Patient Instructions (Addendum)
Please have your Shingrix (shingles) shots done at your local pharmacy.  You had the Prevnar 20 pneumonia shot today  Please continue all other medications as before, and refills have been done if requested.  Please have the pharmacy call with any other refills you may need.  Please continue your efforts at being more active, low cholesterol diet, and weight control.  You are otherwise up to date with prevention measures today.  Please keep your appointments with your specialists as you may have planned  You will be contacted regarding the referral for: colonoscopy, CTA aorta, and Vascular Surgury  Please go to the LAB at the blood drawing area for the tests to be done  You will be contacted by phone if any changes need to be made immediately.  Otherwise, you will receive a letter about your results with an explanation, but please check with MyChart first.  Please remember to sign up for MyChart if you have not done so, as this will be important to you in the future with finding out test results, communicating by private email, and scheduling acute appointments online when needed.  Please make an Appointment to return in 6 months, or sooner if needed

## 2022-11-20 ENCOUNTER — Encounter: Payer: Self-pay | Admitting: Internal Medicine

## 2022-11-20 NOTE — Assessment & Plan Note (Signed)
Lab Results  Component Value Date   HGBA1C 5.8 11/19/2022   Stable, pt to continue current medical treatment  - diet, wt control

## 2022-11-20 NOTE — Assessment & Plan Note (Signed)
Stable, for f/u psa °

## 2022-11-20 NOTE — Assessment & Plan Note (Signed)
Last vitamin D Lab Results  Component Value Date   VD25OH 57.00 11/19/2022   Stable, cont oral replacement

## 2022-11-20 NOTE — Assessment & Plan Note (Signed)
Age and sex appropriate education and counseling updated with regular exercise and diet Referrals for preventative services - for colonoscopy Immunizations addressed - for prevnar 20 today, for shingrix at pharmacy Smoking counseling  - none needed Evidence for depression or other mood disorder - none significant Most recent labs reviewed. I have personally reviewed and have noted: 1) the patient's medical and social history 2) The patient's current medications and supplements 3) The patient's height, weight, and BMI have been recorded in the chart

## 2022-11-20 NOTE — Assessment & Plan Note (Signed)
Due for f/u CTA aortic, restart atenolol, and refer vascular as has been lost to f/u

## 2022-11-20 NOTE — Assessment & Plan Note (Signed)
BP Readings from Last 3 Encounters:  11/19/22 120/78  06/06/21 115/76  12/06/20 123/74   Stable, pt to continue medical treatment tenormin 25 qd,

## 2022-11-20 NOTE — Assessment & Plan Note (Signed)
Stable, denies need for change in tx or referral for counseling

## 2022-12-26 ENCOUNTER — Other Ambulatory Visit: Payer: Medicare HMO

## 2022-12-31 ENCOUNTER — Encounter: Payer: Self-pay | Admitting: Internal Medicine

## 2023-01-22 ENCOUNTER — Other Ambulatory Visit: Payer: Medicare HMO

## 2023-02-26 ENCOUNTER — Ambulatory Visit (AMBULATORY_SURGERY_CENTER): Payer: Medicare HMO | Admitting: *Deleted

## 2023-02-26 ENCOUNTER — Encounter: Payer: Self-pay | Admitting: Internal Medicine

## 2023-02-26 VITALS — Ht 74.0 in | Wt 185.0 lb

## 2023-02-26 DIAGNOSIS — Z800 Family history of malignant neoplasm of digestive organs: Secondary | ICD-10-CM

## 2023-02-26 MED ORDER — NA SULFATE-K SULFATE-MG SULF 17.5-3.13-1.6 GM/177ML PO SOLN
1.0000 | Freq: Once | ORAL | 0 refills | Status: AC
Start: 2023-02-26 — End: 2023-02-26

## 2023-02-26 NOTE — Progress Notes (Signed)
Pt's name and DOB verified at the beginning of the pre-visit.  Pt denies any difficulty with ambulating,sitting, laying down or rolling side to side Gave both [facility] main # and MD on call # prior to instructions.  No egg or soy allergy known to patient  No issues known to pt with past sedation with any surgeries or procedures Pt denies having issues being intubated Pt has no issues moving head neck or swallowing No FH of Malignant Hyperthermia Pt is not on diet pills Pt is not on home 02  Pt is not on blood thinners  Pt denies issues with constipation  Pt is not on dialysis Pt denise any abnormal heart rhythms  Pt denies any upcoming cardiac testing Pt encouraged to use to use Singlecare or Goodrx to reduce cost  Patient's chart reviewed by Cathlyn Parsons CNRA prior to pre-visit and patient appropriate for the [facility].  Pre-visit completed and red dot placed by patient's name on their procedure day (on provider's schedule).  . Visit in person Pt scale weight is185 lb Instructed pt why it is important to and  to call if they have any changes in health or new medications. Directed them to the # given and on instructions.   Pt states they will.  Instructions reviewed with pt and pt states understanding. Instructed to review again prior to procedure. Pt states they will.  Instructions sent by mail with coupon and by my chart

## 2023-03-10 ENCOUNTER — Telehealth: Payer: Self-pay | Admitting: Internal Medicine

## 2023-03-10 ENCOUNTER — Telehealth: Payer: Self-pay | Admitting: *Deleted

## 2023-03-10 NOTE — Telephone Encounter (Signed)
Pt called having questions about AWV not sure how to answer his question please advise.

## 2023-03-10 NOTE — Telephone Encounter (Signed)
Patient called and states he done his AWV at home and he will get the insurance company to send over information so we can update our chart

## 2023-03-20 ENCOUNTER — Encounter: Payer: Medicare HMO | Admitting: Internal Medicine

## 2023-04-12 ENCOUNTER — Encounter: Payer: Self-pay | Admitting: Certified Registered Nurse Anesthetist

## 2023-04-16 ENCOUNTER — Ambulatory Visit: Payer: Medicare HMO | Admitting: Internal Medicine

## 2023-04-16 ENCOUNTER — Encounter: Payer: Self-pay | Admitting: Internal Medicine

## 2023-04-16 VITALS — BP 115/70 | HR 65 | Temp 97.8°F | Resp 14 | Ht 74.0 in | Wt 185.0 lb

## 2023-04-16 DIAGNOSIS — K5140 Inflammatory polyps of colon without complications: Secondary | ICD-10-CM | POA: Diagnosis not present

## 2023-04-16 DIAGNOSIS — D120 Benign neoplasm of cecum: Secondary | ICD-10-CM

## 2023-04-16 DIAGNOSIS — D124 Benign neoplasm of descending colon: Secondary | ICD-10-CM

## 2023-04-16 DIAGNOSIS — Z800 Family history of malignant neoplasm of digestive organs: Secondary | ICD-10-CM | POA: Diagnosis not present

## 2023-04-16 DIAGNOSIS — Z1211 Encounter for screening for malignant neoplasm of colon: Secondary | ICD-10-CM

## 2023-04-16 DIAGNOSIS — D128 Benign neoplasm of rectum: Secondary | ICD-10-CM | POA: Diagnosis not present

## 2023-04-16 MED ORDER — SODIUM CHLORIDE 0.9 % IV SOLN: 500.0000 mL | Freq: Once | INTRAVENOUS | Status: DC

## 2023-04-16 NOTE — Patient Instructions (Addendum)
Please read handouts provided. Continue present medications.   YOU HAD AN ENDOSCOPIC PROCEDURE TODAY AT THE [facility]:   Refer to the procedure report that was given to you for any specific questions about what was found during the examination.  If the procedure report does not answer your questions, please call your gastroenterologist to clarify.  If you requested that your care partner not be given the details of your procedure findings, then the procedure report has been included in a sealed envelope for you to review at your convenience later.  YOU SHOULD EXPECT: Some feelings of bloating in the abdomen. Passage of more gas than usual.  Walking can help get rid of the air that was put into your GI tract during the procedure and reduce the bloating. If you had a lower endoscopy (such as a colonoscopy or flexible sigmoidoscopy) you may notice spotting of blood in your stool or on the toilet paper. If you underwent a bowel prep for your procedure, you may not have a normal bowel movement for a few days.  Please Note:  You might notice some irritation and congestion in your nose or some drainage.  This is from the oxygen used during your procedure.  There is no need for concern and it should clear up in a day or so.  SYMPTOMS TO REPORT IMMEDIATELY:  Following lower endoscopy (colonoscopy or flexible sigmoidoscopy):  Excessive amounts of blood in the stool  Significant tenderness or worsening of abdominal pains  Swelling of the abdomen that is new, acute  Fever of 100F or higher.  For urgent or emergent issues, a gastroenterologist can be reached at any hour by calling [phone]. Do not use MyChart messaging for urgent concerns.    DIET:  We do recommend a small meal at first, but then you may proceed to your regular diet.  Drink plenty of fluids but you should avoid alcoholic beverages for 24 hours.  ACTIVITY:  You should plan to take it easy for the rest of today and you  should NOT DRIVE or use heavy machinery until tomorrow (because of the sedation medicines used during the test).    FOLLOW UP: Our staff will call the number listed on your records the next business day following your procedure.  We will call around 7:15- 8:00 am to check on you and address any questions or concerns that you may have regarding the information given to you following your procedure. If we do not reach you, we will leave a message.     If any biopsies were taken you will be contacted by phone or by letter within the next 1-3 weeks.  Please call us at [phone] if you have not heard about the biopsies in 3 weeks.    SIGNATURES/CONFIDENTIALITY: You and/or your care partner have signed paperwork which will be entered into your electronic medical record.  These signatures attest to the fact that that the information above on your After Visit Summary has been reviewed and is understood.  Full responsibility of the confidentiality of this discharge information lies with you and/or your care-partner.I found and removed 3 tiny polyps today.  I will let you know pathology results and when to have another routine colonoscopy by mail and/or My Chart.  I appreciate the opportunity to care for you. Iva Boop, MD, [facility]

## 2023-04-16 NOTE — Progress Notes (Signed)
Pt's states no medical or surgical changes since previsit or office visit. VS assessed by C.W 

## 2023-04-16 NOTE — Op Note (Signed)
[facility] Patient Name: James Mullen Procedure Date: 04/16/2023 7:37 AM MRN: [id number] Endoscopist: Iva Boop , MD, [id number] Age: 72 Referring MD:  Date of Birth: [birth year] Gender: Male Account #: [id number] Procedure:                Colonoscopy Indications:              Screening in patient at increased risk: Colorectal                            cancer in father before age 66 = prior hx colon                            polyps 2012 Medicines:                Monitored Anesthesia Care Procedure:                Pre-Anesthesia Assessment:                           - Prior to the procedure, a History and Physical                            was performed, and patient medications and                            allergies were reviewed. The patient's tolerance of                            previous anesthesia was also reviewed. The risks                            and benefits of the procedure and the sedation                            options and risks were discussed with the patient.                            All questions were answered, and informed consent                            was obtained. Prior Anticoagulants: The patient has                            taken no anticoagulant or antiplatelet agents. ASA                            Grade Assessment: II - A patient with mild systemic                            disease. After reviewing the risks and benefits,                            the patient was deemed in satisfactory condition to  undergo the procedure.                           After obtaining informed consent, the colonoscope                            was passed under direct vision. Throughout the                            procedure, the patient's blood pressure, pulse, and                            oxygen saturations were monitored continuously. The                            CF [id number] #[id number] was introduced through the  anus                            and advanced to the the cecum, identified by                            appendiceal orifice and ileocecal valve. The                            colonoscopy was performed without difficulty. The                            patient tolerated the procedure well. The quality                            of the bowel preparation was adequate. The                            ileocecal valve, appendiceal orifice, and rectum                            were photographed. The bowel preparation used was                            SUPREP via split dose instruction. Scope In: 8:07:19 AM Scope Out: 8:23:15 AM Scope Withdrawal Time: 0 hours 12 minutes 16 seconds  Total Procedure Duration: 0 hours 15 minutes 56 seconds  Findings:                 The perianal and digital rectal examinations were                            normal.                           Three sessile polyps were found in the rectum,                            descending colon and cecum. The polyps were  diminutive in size. These polyps were removed with                            a cold snare. Resection and retrieval were                            complete. Verification of patient identification                            for the specimen was done. Estimated blood loss was                            minimal.                           The exam was otherwise without abnormality on                            direct and retroflexion views. Complications:            No immediate complications. Estimated Blood Loss:     Estimated blood loss was minimal. Impression:               - Three diminutive polyps in the rectum, in the                            descending colon and in the cecum, removed with a                            cold snare. Resected and retrieved.                           - The examination was otherwise normal on direct                            and retroflexion  views.                           - Personal history of colonic polyps. 2 diminutive                            polyps 2012 Recommendation:           - Patient has a contact number available for                            emergencies. The signs and symptoms of potential                            delayed complications were discussed with the                            patient. Return to normal activities tomorrow.                            Written discharge instructions were provided  to the                            patient.                           - Resume previous diet.                           - Continue present medications.                           - Repeat colonoscopy is recommended. The                            colonoscopy date will be determined after pathology                            results from today's exam become available for                            review. Iva Boop, MD 04/16/2023 8:34:20 AM This report has been signed electronically.

## 2023-04-16 NOTE — Progress Notes (Signed)
Called to room to assist during endoscopic procedure.  Patient ID and intended procedure confirmed with present staff. Received instructions for my participation in the procedure from the performing physician.  

## 2023-04-16 NOTE — Progress Notes (Signed)
Report given to PACU, vss 

## 2023-04-16 NOTE — Progress Notes (Signed)
[facility] History and Physical   Primary Care Physician:  Corwin Levins, MD   Reason for Procedure:    Encounter Diagnosis  Name Primary?   Family history of malignant neoplasm of gastrointestinal tract Yes     Plan:    colonoscopy     HPI: James Mullen is a 72 y.o. male for screening evaluation   Past Medical History:  Diagnosis Date   ANEMIA-NOS 07/08/2008   ANXIETY 07/08/2008   Patient denies.   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 does not use CPAP    Past Surgical History:  Procedure Laterality Date   COLONOSCOPY  multiple, last 03/08/2011   up until 2012: no adenomas 2012: 2 diminutive polyps   ILIAC ARTERY ANEURYSM REPAIR  2007   POLYPECTOMY     RADIOACTIVE SEED IMPLANT N/A 06/29/2018   Procedure: RADIOACTIVE SEED IMPLANT/BRACHYTHERAPY IMPLANT;  Surgeon: Malen Gauze, MD;  Location: [facility];  Service: Urology;  Laterality: N/A;  2 HRS   SPACE OAR INSTILLATION N/A 06/29/2018   Procedure: SPACE OAR INSTILLATION;  Surgeon: Malen Gauze, MD;  Location: [facility];  Service: Urology;  Laterality: N/A;   THORACOABDOMINAL AORTIC ANEURYSM REPAIR  1997   TONSILLECTOMY     UVULOPALATOPHARYNGOPLASTY  1988    Prior to Admission medications   Medication Sig Start Date End Date Taking? Authorizing Provider  ascorbic acid (VITAMIN C) 250 MG CHEW Chew 250 mg by mouth daily.   Yes [provider]  Cholecalciferol (D3 2000 PO) Take by mouth.   Yes [provider]  Coenzyme Q10 (COQ10 PO) Take by mouth daily. 400 mg   Yes [provider]  Cyanocobalamin (B-12) 50 MCG TABS Take by mouth. 150 mg   Yes [provider]  Fish Oil OIL by Does not apply route daily.   Yes [provider]  lactobacillus acidophilus (BACID) TABS tablet Take 2 tablets by mouth 3 (three) times daily.   Yes [provider]  Magnesium 500 MG CAPS Take by mouth. 100 mg   Yes [provider]  Menaquinone-7 (K2 PO) Take by mouth.   Yes [provider]  niacin (,VITAMIN B3,) 500 MG tablet Take 500 mg by mouth at bedtime.   Yes [provider]  Nutritional Supplements (DHEA PO) Take by mouth daily.   Yes [provider]  PREBIOTIC PRODUCT PO Take by mouth.   Yes [provider]  pyridoxine (B-6) 100 MG tablet Take 100 mg by mouth daily.   Yes [provider]  Zinc 50 MG TABS Take by mouth.   Yes [provider]    Current Outpatient Medications  Medication Sig Dispense Refill   ascorbic acid (VITAMIN C) 250 MG CHEW Chew 250 mg by mouth daily.     Cholecalciferol (D3 2000 PO) Take by mouth.     Coenzyme Q10 (COQ10 PO) Take by mouth daily. 400 mg     Cyanocobalamin (B-12) 50 MCG TABS Take by mouth. 150 mg     Fish Oil OIL by Does not apply route daily.     lactobacillus acidophilus (BACID) TABS tablet Take 2 tablets by mouth 3 (three) times daily.  Magnesium 500 MG CAPS Take by mouth. 100 mg     Menaquinone-7 (K2 PO) Take by mouth.     niacin (,VITAMIN B3,) 500 MG tablet Take 500 mg by mouth at bedtime.     Nutritional Supplements (DHEA PO) Take by mouth daily.     PREBIOTIC PRODUCT PO Take by mouth.     pyridoxine (B-6) 100 MG tablet Take 100 mg by mouth daily.     Zinc 50 MG TABS Take by mouth.     Current Facility-Administered Medications  Medication Dose Route Frequency Provider Last Rate Last Admin   0.9 %  sodium chloride infusion  500 mL Intravenous Once Iva Boop, MD        Allergies as of 04/16/2023 - Review Complete 04/16/2023  Allergen Reaction Noted   Penicillins Hives 07/08/2008    Family History  Problem Relation Age of  Onset   Breast cancer Mother    Colon cancer Father 75   Colon cancer Cousin    Esophageal cancer Neg Hx    Rectal cancer Neg Hx    Stomach cancer Neg Hx    Pancreatic cancer Neg Hx    Prostate cancer Neg Hx    Colon polyps Neg Hx     Social History   Socioeconomic History   Marital status: Married    Spouse name: Not on file   Number of children: 1   Years of education: Not on file   Highest education level: Not on file  Occupational History   Occupation: CFO   Tobacco Use   Smoking status: Never   Smokeless tobacco: Never  Vaping Use   Vaping status: Never Used  Substance and Sexual Activity   Alcohol use: Yes    Alcohol/week: 2.0 standard drinks of alcohol    Types: 1 Glasses of wine, 1 Cans of beer per week   Drug use: No   Sexual activity: Yes  Other Topics Concern   Not on file  Social History Narrative   Retired in December 2018. 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   Social Determinants of Health   Financial Resource Strain: Not on file  Food Insecurity: Not on file  Transportation Needs: Not on file  Physical Activity: Not on file  Stress: Not on file  Social Connections: Not on file  Intimate Partner Violence: Not on file    Review of Systems:  All other review of systems negative except as mentioned in the HPI.  Physical Exam: Vital signs BP 113/76   Pulse 79   Temp 97.8 F (36.6 C) (Skin)   Resp 10   Ht 6\' 2"  (1.88 m)   Wt 185 lb (83.9 kg)   SpO2 98%   BMI 23.75 kg/m   General:   Alert,  Well-developed, well-nourished, pleasant and cooperative in NAD Lungs:  Clear throughout to auscultation.   Heart:  Regular rate and rhythm; no murmurs, clicks, rubs,  or gallops. Abdomen:  Soft, nontender and nondistended. Normal bowel sounds.   Neuro/Psych:  Alert and cooperative. Normal mood and affect. A and O x 3   @Mirtha Jain  Sena Slate, MD, [facility] [phone] (pager) 04/16/2023 8:00 AM@

## 2023-04-17 ENCOUNTER — Telehealth: Payer: Self-pay | Admitting: *Deleted

## 2023-04-17 NOTE — Telephone Encounter (Signed)
  Follow up Call-     04/16/2023    7:13 AM  Call back number  Post procedure Call Back phone  # [phone]  Permission to leave phone message Yes     Patient questions:  Do you have a fever, pain , or abdominal swelling? No. Pain Score  0 *  Have you tolerated food without any problems? Yes.    Have you been able to return to your normal activities? Yes.    Do you have any questions about your discharge instructions: Diet   No. Medications  No. Follow up visit  No.  Do you have questions or concerns about your Care? No.  Actions: * If pain score is 4 or above: No action needed, pain <4.

## 2023-04-18 LAB — SURGICAL PATHOLOGY

## 2023-04-21 ENCOUNTER — Encounter: Payer: Self-pay | Admitting: Internal Medicine

## 2023-05-19 LAB — LAB REPORT - SCANNED: A1c: 5.5

## 2023-05-21 ENCOUNTER — Ambulatory Visit: Payer: Medicare HMO | Admitting: Internal Medicine

## 2023-06-16 HISTORY — PX: REPLACEMENT TOTAL KNEE: SUR1224

## 2023-09-25 DIAGNOSIS — Z96652 Presence of left artificial knee joint: Secondary | ICD-10-CM | POA: Diagnosis not present

## 2023-09-25 DIAGNOSIS — M25562 Pain in left knee: Secondary | ICD-10-CM | POA: Diagnosis not present

## 2023-09-25 DIAGNOSIS — Z471 Aftercare following joint replacement surgery: Secondary | ICD-10-CM | POA: Diagnosis not present

## 2023-10-06 ENCOUNTER — Ambulatory Visit (INDEPENDENT_AMBULATORY_CARE_PROVIDER_SITE_OTHER)

## 2023-10-06 VITALS — Ht 74.0 in | Wt 185.0 lb

## 2023-10-06 DIAGNOSIS — Z0000 Encounter for general adult medical examination without abnormal findings: Secondary | ICD-10-CM | POA: Diagnosis not present

## 2023-10-06 NOTE — Patient Instructions (Addendum)
 James Mullen , Thank you for taking time to come for your Medicare Wellness Visit. I appreciate your ongoing commitment to your health goals. Please review the following plan we discussed and let me know if I can assist you in the future.   Referrals/Orders/Follow-Ups/Clinician Recommendations: It was nice to speak with you today.  You are due for a tetanus vaccine and a Shingles vaccine.  Keep up the good work.   This is a list of the screening recommended for you and due dates:  Health Maintenance  Topic Date Due   Medicare Annual Wellness Visit  Never done   Zoster (Shingles) Vaccine (1 of 2) Never done   DTaP/Tdap/Td vaccine (3 - Td or Tdap) 11/30/2022   Flu Shot  01/30/2024   Colon Cancer Screening  04/15/2028   Pneumonia Vaccine  Completed   Hepatitis C Screening  Completed   HPV Vaccine  Aged Out   COVID-19 Vaccine  Discontinued    Advanced directives: (Copy Requested) Please bring a copy of your health care power of attorney and living will to the office to be added to your chart at your convenience. You can mail to [facility] [address] [place], [place] [address] or email to [email or web address]  Next Medicare Annual Wellness Visit scheduled for next year: Yes

## 2023-10-06 NOTE — Progress Notes (Signed)
 Subjective:   James Mullen is a 73 y.o. who presents for a Medicare Wellness preventive visit.  Visit Complete: Virtual I connected with  James Mullen on 10/06/23 by a video and audio enabled telemedicine application and verified that I am speaking with the correct person using two identifiers.  Patient Location: Home  Provider Location: Home Office  I discussed the limitations of evaluation and management by telemedicine. The patient expressed understanding and agreed to proceed.  Vital Signs: Because this visit was a virtual/telehealth visit, some criteria may be missing or patient reported. Any vitals not documented were not able to be obtained and vitals that have been documented are patient reported.   Persons Participating in Visit: Patient.  AWV Questionnaire: No: Patient Medicare AWV questionnaire was not completed prior to this visit.  Cardiac Risk Factors include: advanced age (>19men, >67 women);male gender;hypertension;Other (see comment), Risk factor comments: OSA     Objective:    Today's Vitals   10/06/23 1548  Weight: 185 lb (83.9 kg)  Height: 6\' 2"  (1.88 m)   Body mass index is 23.75 kg/m.     06/29/2018    9:21 AM 10/01/2016    2:39 PM 09/20/2016    1:09 PM 09/19/2014    8:07 PM  Advanced Directives  Does Patient Have a Medical Advance Directive? Yes Yes Yes Yes  Type of Advance Directive Living will  Healthcare Power of [place];Living will Living will  Does patient want to make changes to medical advance directive? No - Patient declined   No - Patient declined  Copy of Healthcare Power of Attorney in Chart? No - copy requested   No - copy requested    Current Medications (verified) Outpatient Encounter Medications as of 10/06/2023  Medication Sig   ascorbic acid (VITAMIN C) 250 MG CHEW Chew 250 mg by mouth daily.   Cholecalciferol (D3 2000 PO) Take by mouth.   Coenzyme Q10 (COQ10 PO) Take by mouth daily. 400 mg   Cyanocobalamin (B-12) 50  MCG TABS Take by mouth. 150 mg   Fish Oil OIL by Does not apply route daily.   lactobacillus acidophilus (BACID) TABS tablet Take 2 tablets by mouth 3 (three) times daily.   Magnesium 500 MG CAPS Take by mouth. 100 mg   Menaquinone-7 (K2 PO) Take by mouth.   niacin (,VITAMIN B3,) 500 MG tablet Take 500 mg by mouth at bedtime.   Nutritional Supplements (DHEA PO) Take by mouth daily.   PREBIOTIC PRODUCT PO Take by mouth.   pyridoxine (B-6) 100 MG tablet Take 100 mg by mouth daily.   Zinc 50 MG TABS Take by mouth.   No facility-administered encounter medications on file as of 10/06/2023.    Allergies (verified) Penicillins   History: Past Medical History:  Diagnosis Date   ANEMIA-NOS 07/08/2008   ANXIETY 07/08/2008   Patient denies.   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 does not use CPAP   Past Surgical History:  Procedure Laterality Date   COLONOSCOPY  multiple, last 03/08/2011   up until 2012: no adenomas 2012: 2 diminutive polyps   ILIAC ARTERY ANEURYSM REPAIR  2007   POLYPECTOMY     RADIOACTIVE SEED IMPLANT N/A 06/29/2018  Procedure: RADIOACTIVE SEED IMPLANT/BRACHYTHERAPY IMPLANT;  Surgeon: Malen Gauze, MD;  Location: [facility];  Service: Urology;  Laterality: N/A;  2 HRS   REPLACEMENT TOTAL KNEE Left 06/16/2023   SPACE OAR INSTILLATION N/A 06/29/2018   Procedure: SPACE OAR INSTILLATION;  Surgeon: Malen Gauze, MD;  Location: [facility];  Service: Urology;  Laterality: N/A;   THORACOABDOMINAL AORTIC ANEURYSM REPAIR  1997   TONSILLECTOMY     UVULOPALATOPHARYNGOPLASTY  1988   Family History  Problem Relation Age of Onset   Breast cancer Mother    Colon cancer  Father 42   Colon cancer Cousin    Esophageal cancer Neg Hx    Rectal cancer Neg Hx    Stomach cancer Neg Hx    Pancreatic cancer Neg Hx    Prostate cancer Neg Hx    Colon polyps Neg Hx    Social History   Socioeconomic History   Marital status: Married    Spouse name: Tri[place]   Number of children: 1   Years of education: Not on file   Highest education level: Not on file  Occupational History   Occupation: CFO   Tobacco Use   Smoking status: Never   Smokeless tobacco: Never  Vaping Use   Vaping status: Never Used  Substance and Sexual Activity   Alcohol use: Yes    Alcohol/week: 2.0 standard drinks of alcohol    Types: 1 Glasses of wine, 1 Cans of beer per week   Drug use: No   Sexual activity: Yes  Other Topics Concern   Not on file  Social History Narrative   Retired in December 2018. 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   Lives with wife and has a dog.   Social Drivers of Corporate investment banker Strain: Low Risk  (10/06/2023)   Overall Financial Resource Strain (CARDIA)    Difficulty of Paying Living Expenses: Not hard at all  Food Insecurity: No Food Insecurity (10/06/2023)   Hunger Vital Sign    Worried About Running Out of Food in the Last Year: Never true    Ran Out of Food in the Last Year: Never true  Transportation Needs: No Transportation Needs (10/06/2023)   PRAPARE - Administrator, Civil Service (Medical): No    Lack of Transportation (Non-Medical): No  Physical Activity: Sufficiently Active (10/06/2023)   Exercise Vital Sign    Days of Exercise per Week: 4 days    Minutes of Exercise per Session: 40 min  Stress: No Stress Concern Present (10/06/2023)   [employer] of Occupational Health - Occupational Stress Questionnaire    Feeling of Stress : Not at all  Social Connections: Moderately Isolated (10/06/2023)   Social Connection and Isolation Panel [NHANES]    Frequency of Communication with Friends and Family: More than  three times a week    Frequency of Social Gatherings with Friends and Family: More than three times a week    Attends Religious Services: Never    Database administrator or Organizations: No    Attends Engineer, structural: Never    Marital Status: Married    Tobacco Counseling Counseling given: Not Answered    Clinical Intake:  Pre-visit preparation completed: Yes  Pain : No/denies pain     BMI - recorded: 23.75 Nutritional Status: BMI of 19-24  Normal Nutritional Risks: None Diabetes: No  Lab Results  Component Value Date   HGBA1C 5.8  11/19/2022   HGBA1C 5.8 06/02/2020   HGBA1C 5.7 06/02/2019     How often do you need to have someone help you when you read instructions, pamphlets, or other written materials from your doctor or pharmacy?: 1 - Never  Interpreter Needed?: No  Information entered by :: Lota Leamer, RMA   Activities of Daily Living     10/06/2023    3:48 PM 03/09/2023   10:43 AM  In your present state of health, do you have any difficulty performing the following activities:  Hearing? 0 0  Vision? 0 0  Difficulty concentrating or making decisions? 0 1  Walking or climbing stairs? 0 0  Dressing or bathing? 0 0  Doing errands, shopping? 0 0  Preparing Food and eating ? N N  Using the Toilet? N N  In the past six months, have you accidently leaked urine? N N  Do you have problems with loss of bowel control? N N  Managing your Medications? N N  Managing your Finances? N N  Housekeeping or managing your Housekeeping? N N    Patient Care Team: Corwin Levins, MD as PCP - General  Indicate any recent Medical Services you may have received from other than [facility] providers in the past year (date may be approximate).     Assessment:   This is a routine wellness examination for [place].  Hearing/Vision screen Hearing Screening - Comments:: Denies hearing difficulties   Vision Screening - Comments:: Denies vision issues.    Goals  Addressed               This Visit's Progress     Patient Stated (pt-stated)        To stay healthy       Depression Screen     10/06/2023    3:57 PM 11/19/2022    8:18 AM 06/02/2020    8:29 AM 11/27/2018    9:31 AM 04/15/2018    2:03 PM 11/27/2016   11:40 AM 04/01/2014    3:08 PM  PHQ 2/9 Scores  PHQ - 2 Score 0 0 0 0 0 0 0  PHQ- 9 Score 3 3   0      Fall Risk     10/06/2023    3:55 PM 11/19/2022    8:18 AM 06/02/2020    8:29 AM 11/27/2018    9:31 AM 05/27/2018   10:26 AM  Fall Risk   Falls in the past year? 0 0 0 0 0  Number falls in past yr: 0 0     Injury with Fall? 0 0     Risk for fall due to : No Fall Risks No Fall Risks     Follow up Falls prevention discussed;Falls evaluation completed Falls evaluation completed       MEDICARE RISK AT HOME:  Medicare Risk at Home Any stairs in or around the home?: Yes If so, are there any without handrails?: Yes Home free of loose throw rugs in walkways, pet beds, electrical cords, etc?: Yes Adequate lighting in your home to reduce risk of falls?: Yes Life alert?: No Use of a cane, walker or w/c?: No Grab bars in the bathroom?: Yes Shower chair or bench in shower?: No Elevated toilet seat or a handicapped toilet?: No  TIMED UP AND GO:  Was the test performed?  No  Cognitive Function: Declined: Patient declined cognitive screening, but was able to answer questions in an accurate and timely manner. No cognitive impairments observed.  Immunizations Immunization History  Administered Date(s) Administered   PNEUMOCOCCAL CONJUGATE-20 11/19/2022   Td 07/08/2008   Tdap 11/29/2012    Screening Tests Health Maintenance  Topic Date Due   Medicare Annual Wellness (AWV)  Never done   Zoster Vaccines- Shingrix (1 of 2) Never done   DTaP/Tdap/Td (3 - Td or Tdap) 11/30/2022   INFLUENZA VACCINE  01/30/2024   Colonoscopy  04/15/2028   Pneumonia Vaccine 82+ Years old  Completed   Hepatitis C Screening  Completed    HPV VACCINES  Aged Out   COVID-19 Vaccine  Discontinued    Health Maintenance  Health Maintenance Due  Topic Date Due   Medicare Annual Wellness (AWV)  Never done   Zoster Vaccines- Shingrix (1 of 2) Never done   DTaP/Tdap/Td (3 - Td or Tdap) 11/30/2022   Health Maintenance Items Addressed: See Nurse Notes  Additional Screening:  Vision Screening: Recommended annual ophthalmology exams for early detection of glaucoma and other disorders of the eye.  Dental Screening: Recommended annual dental exams for proper oral hygiene  Community Resource Referral / Chronic Care Management: CRR required this visit?  No   CCM required this visit?  No     Plan:     I have personally reviewed and noted the following in the patient's chart:   Medical and social history Use of alcohol, tobacco or illicit drugs  Current medications and supplements including opioid prescriptions. Patient is not currently taking opioid prescriptions. Functional ability and status Nutritional status Physical activity Advanced directives List of other physicians Hospitalizations, surgeries, and ER visits in previous 12 months Vitals Screenings to include cognitive, depression, and falls Referrals and appointments  In addition, I have reviewed and discussed with patient certain preventive protocols, quality metrics, and best practice recommendations. A written personalized care plan for preventive services as well as general preventive health recommendations were provided to patient.     Sedra Morfin L Cyenna Rebello, CMA   10/06/2023   After Visit Summary: (MyChart) Due to this being a telephonic visit, the after visit summary with patients personalized plan was offered to patient via MyChart   Notes: Please refer to Routing Comments.

## 2024-02-04 ENCOUNTER — Other Ambulatory Visit: Payer: Self-pay

## 2024-02-04 DIAGNOSIS — I7143 Infrarenal abdominal aortic aneurysm, without rupture: Secondary | ICD-10-CM

## 2024-02-10 ENCOUNTER — Ambulatory Visit
Admission: RE | Admit: 2024-02-10 | Discharge: 2024-02-10 | Disposition: A | Discharge status: Home / Self Care | Source: Ambulatory Visit | Attending: Vascular Surgery | Admitting: Vascular Surgery

## 2024-02-10 DIAGNOSIS — I7143 Infrarenal abdominal aortic aneurysm, without rupture: Secondary | ICD-10-CM

## 2024-02-10 DIAGNOSIS — K8020 Calculus of gallbladder without cholecystitis without obstruction: Secondary | ICD-10-CM | POA: Diagnosis not present

## 2024-02-10 DIAGNOSIS — J8410 Pulmonary fibrosis, unspecified: Secondary | ICD-10-CM | POA: Diagnosis not present

## 2024-02-10 MED ORDER — IOPAMIDOL (ISOVUE-370) INJECTION 76%
100.0000 mL | Freq: Once | INTRAVENOUS | Status: AC | PRN
  Administered 2024-02-10 (×2): 100 mL via INTRAVENOUS

## 2024-03-16 NOTE — Progress Notes (Unsigned)
 Office Note     CC:  5-year follow up Requesting Provider:  Norleen Lynwood ORN, MD  HPI: James Mullen is a 73 y.o. 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  This was done by Dr. Fleeta Ochoa and Dr. Dyane.  Graft extended from the descending thoracic aorta to the renal arteries.  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 Dyane.  He has done well since then.    On exam today, James Mullen was doing well.  He had no complaints. Denied claudication, ischemic rest pain, tissue loss  Past Medical History:  Diagnosis Date   ANEMIA-NOS 07/08/2008   ANXIETY 07/08/2008   Patient denies.   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 does not use CPAP    Past Surgical History:  Procedure Laterality Date   COLONOSCOPY  multiple, last 03/08/2011   up until 2012: no adenomas 2012: 2 diminutive polyps   ILIAC ARTERY ANEURYSM REPAIR  2007   POLYPECTOMY     RADIOACTIVE SEED IMPLANT N/A 06/29/2018   Procedure: RADIOACTIVE SEED IMPLANT/BRACHYTHERAPY IMPLANT;  Surgeon: Sherrilee Belvie LITTIE, MD;  Location: [facility];  Service: Urology;  Laterality: N/A;  2 HRS   REPLACEMENT TOTAL KNEE Left 06/16/2023   SPACE OAR INSTILLATION N/A 06/29/2018   Procedure: SPACE OAR INSTILLATION;  Surgeon: Sherrilee Belvie LITTIE, MD;  Location: [facility];  Service: Urology;  Laterality: N/A;   THORACOABDOMINAL AORTIC ANEURYSM REPAIR  1997   TONSILLECTOMY     UVULOPALATOPHARYNGOPLASTY  1988    Social History   Socioeconomic History   Marital status: Married    Spouse name:  [place]   Number of children: 1   Years of education: Not on file   Highest education level: Not on file  Occupational History   Occupation: CFO   Tobacco Use   Smoking status: Never   Smokeless tobacco: Never  Vaping Use   Vaping status: Never Used  Substance and Sexual Activity   Alcohol use: Yes    Alcohol/week: 2.0 standard drinks of alcohol    Types: 1 Glasses of wine, 1 Cans of beer per week   Drug use: No   Sexual activity: Yes  Other Topics Concern   Not on file  Social History Narrative   Retired in December 2018. 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   Lives with wife and has a dog.   Social Drivers of Corporate investment banker Strain: Low Risk  (10/06/2023)   Overall Financial Resource Strain (CARDIA)    Difficulty of Paying Living Expenses: Not hard at all  Food Insecurity: No Food Insecurity (10/06/2023)   Hunger Vital Sign    Worried About Running Out of Food in the Last Year: Never true    Ran Out of Food in the Last Year: Never true  Transportation Needs: No Transportation Needs (10/06/2023)   PRAPARE - Administrator, Civil Service (Medical): No    Lack of Transportation (Non-Medical): No  Physical Activity: Sufficiently Active (10/06/2023)   Exercise Vital Sign  Days of Exercise per Week: 4 days    Minutes of Exercise per Session: 40 min  Stress: No Stress Concern Present (10/06/2023)   [employer] of Occupational Health - Occupational Stress Questionnaire    Feeling of Stress : Not at all  Social Connections: Moderately Isolated (10/06/2023)   Social Connection and Isolation Panel    Frequency of Communication with Friends and Family: More than three times a week    Frequency of Social Gatherings with Friends and Family: More than three times a week    Attends Religious Services: Never    Database administrator or Organizations: No    Attends Banker Meetings: Never    Marital Status: Married  Catering manager  Violence: Not At Risk (10/06/2023)   Humiliation, Afraid, Rape, and Kick questionnaire    Fear of Current or Ex-Partner: No    Emotionally Abused: No    Physically Abused: No    Sexually Abused: No   Family History  Problem Relation Age of Onset   Breast cancer Mother    Colon cancer Father 74   Colon cancer Cousin    Esophageal cancer Neg Hx    Rectal cancer Neg Hx    Stomach cancer Neg Hx    Pancreatic cancer Neg Hx    Prostate cancer Neg Hx    Colon polyps Neg Hx     Current Outpatient Medications  Medication Sig Dispense Refill   ascorbic acid (VITAMIN C) 250 MG CHEW Chew 250 mg by mouth daily.     Cholecalciferol (D3 2000 PO) Take by mouth.     Coenzyme Q10 (COQ10 PO) Take by mouth daily. 400 mg     Cyanocobalamin  (B-12) 50 MCG TABS Take by mouth. 150 mg     Fish Oil OIL by Does not apply route daily.     lactobacillus acidophilus (BACID) TABS tablet Take 2 tablets by mouth 3 (three) times daily.     Magnesium 500 MG CAPS Take by mouth. 100 mg     Menaquinone-7 (K2 PO) Take by mouth.     niacin (,VITAMIN B3,) 500 MG tablet Take 500 mg by mouth at bedtime.     Nutritional Supplements (DHEA PO) Take by mouth daily.     PREBIOTIC PRODUCT PO Take by mouth.     pyridoxine (B-6) 100 MG tablet Take 100 mg by mouth daily.     Zinc 50 MG TABS Take by mouth.     No current facility-administered medications for this visit.    Allergies  Allergen Reactions   Penicillins Hives     REVIEW OF SYSTEMS:  [X]  denotes positive finding, [ ]  denotes negative finding Cardiac  Comments:  Chest pain or chest pressure:    Shortness of breath upon exertion:    Short of breath when lying flat:    Irregular heart rhythm:        Vascular    Pain in calf, thigh, or hip brought on by ambulation:    Pain in feet at night that wakes you up from your sleep:     Blood clot in your veins:    Leg swelling:         Pulmonary    Oxygen at home:    Productive cough:     Wheezing:          Neurologic    Sudden weakness in arms or legs:     Sudden numbness in arms or legs:  Sudden onset of difficulty speaking or slurred speech:    Temporary loss of vision in one eye:     Problems with dizziness:         Gastrointestinal    Blood in stool:     Vomited blood:         Genitourinary    Burning when urinating:     Blood in urine:        Psychiatric    Major depression:         Hematologic    Bleeding problems:    Problems with blood clotting too easily:        Skin    Rashes or ulcers:        Constitutional    Fever or chills:      PHYSICAL EXAMINATION:  There were no vitals filed for this visit.  General:  WDWN in NAD; vital signs documented above Gait: Not observed HENT: WNL, normocephalic Pulmonary: normal non-labored breathing , without wheezing Cardiac: regular HR Abdomen: soft, NT, no masses Skin: without rashes Vascular Exam/Pulses:  Right Left  Radial 2+ (normal) 2+ (normal)  Ulnar    Femoral    Popliteal    DP 2+ (normal) 2+ (normal)  PT     Extremities: without ischemic changes, without Gangrene , without cellulitis; without open wounds;  Musculoskeletal: no muscle wasting or atrophy  Neurologic: A&O X 3;  No focal weakness or paresthesias are detected Psychiatric:  The pt has Normal affect.   Non-Invasive Vascular Imaging:   See recent CT scan    ASSESSMENT/PLAN: JALONI DAVOLI is a 73 y.o. 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  This was done by Dr. Fleeta Ochoa and Dr. Dyane.  Graft extended from the descending thoracic aorta to the renal arteries.  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 Dyane.  CT scan from 02/10/2024 was independently reviewed demonstrating no changes from previous.  He has a slight kink in the left limb, however this is stable, no concerns.  Excellent, palpable pulse in the groins and  feet.  Will plan to see him again in 5 years.  I have no concerns at this time.    Fonda FORBES Rim, MD [facility] [phone]

## 2024-03-18 ENCOUNTER — Encounter: Payer: Self-pay | Admitting: Vascular Surgery

## 2024-03-18 ENCOUNTER — Ambulatory Visit: Attending: Vascular Surgery | Admitting: Vascular Surgery

## 2024-03-18 VITALS — BP 130/74 | HR 90 | Temp 97.7°F | Resp 18 | Ht 74.0 in | Wt 170.1 lb

## 2024-03-18 DIAGNOSIS — I7100 Dissection of unspecified site of aorta: Secondary | ICD-10-CM | POA: Diagnosis not present

## 2024-03-18 DIAGNOSIS — I7143 Infrarenal abdominal aortic aneurysm, without rupture: Secondary | ICD-10-CM | POA: Diagnosis not present

## 2024-03-18 DIAGNOSIS — Z9889 Other specified postprocedural states: Secondary | ICD-10-CM | POA: Diagnosis not present

## 2024-04-01 DIAGNOSIS — M25562 Pain in left knee: Secondary | ICD-10-CM | POA: Diagnosis not present

## 2024-04-01 DIAGNOSIS — Z96652 Presence of left artificial knee joint: Secondary | ICD-10-CM | POA: Diagnosis not present

## 2024-04-01 DIAGNOSIS — M1712 Unilateral primary osteoarthritis, left knee: Secondary | ICD-10-CM | POA: Diagnosis not present

## 2024-04-01 DIAGNOSIS — Z471 Aftercare following joint replacement surgery: Secondary | ICD-10-CM | POA: Diagnosis not present
